# Patient Record
Sex: Female | Born: 1935 | Race: White | Hispanic: No | Smoking: Never smoker
Health system: Southern US, Community
[De-identification: ages and names within clinical notes are randomized; demographics above are authoritative.]

## PROBLEM LIST (undated history)

## (undated) DIAGNOSIS — M1990 Unspecified osteoarthritis, unspecified site: Secondary | ICD-10-CM

## (undated) DIAGNOSIS — I10 Essential (primary) hypertension: Secondary | ICD-10-CM

## (undated) DIAGNOSIS — J449 Chronic obstructive pulmonary disease, unspecified: Secondary | ICD-10-CM

## (undated) DIAGNOSIS — M419 Scoliosis, unspecified: Secondary | ICD-10-CM

## (undated) HISTORY — PX: KNEE SURGERY: SHX244

---

## 2004-11-22 ENCOUNTER — Inpatient Hospital Stay (HOSPITAL_COMMUNITY): Admission: EM | Admit: 2004-11-22 | Discharge: 2004-12-01 | Payer: Self-pay | Admitting: Emergency Medicine

## 2008-08-25 ENCOUNTER — Encounter (INDEPENDENT_AMBULATORY_CARE_PROVIDER_SITE_OTHER): Payer: Self-pay | Admitting: Family Medicine

## 2008-08-25 ENCOUNTER — Ambulatory Visit: Payer: Self-pay | Admitting: Cardiology

## 2008-08-25 ENCOUNTER — Ambulatory Visit (HOSPITAL_COMMUNITY): Admission: RE | Admit: 2008-08-25 | Discharge: 2008-08-25 | Payer: Self-pay | Admitting: Family Medicine

## 2010-07-29 ENCOUNTER — Inpatient Hospital Stay (HOSPITAL_COMMUNITY): Admission: EM | Admit: 2010-07-29 | Discharge: 2010-08-02 | Payer: Self-pay | Admitting: Emergency Medicine

## 2010-08-01 ENCOUNTER — Ambulatory Visit: Payer: Self-pay | Admitting: Orthopedic Surgery

## 2010-08-23 ENCOUNTER — Ambulatory Visit (HOSPITAL_COMMUNITY): Admission: RE | Admit: 2010-08-23 | Discharge: 2010-08-23 | Payer: Self-pay | Admitting: Family Medicine

## 2010-10-11 ENCOUNTER — Encounter (HOSPITAL_COMMUNITY)
Admission: RE | Admit: 2010-10-11 | Discharge: 2010-11-10 | Payer: Self-pay | Source: Home / Self Care | Attending: Family Medicine | Admitting: Family Medicine

## 2010-10-11 ENCOUNTER — Ambulatory Visit (HOSPITAL_COMMUNITY): Payer: Self-pay | Admitting: Family Medicine

## 2010-12-03 ENCOUNTER — Encounter: Payer: Self-pay | Admitting: Family Medicine

## 2010-12-13 NOTE — Consult Note (Signed)
Summary: Hospital consult T-spine  Hospital consult T-spine   Imported By: Cammie Sickle 08/10/2010 14:09:52  _____________________________________________________________________  External Attachment:    Type:   Image     Comment:   External Document

## 2011-01-26 LAB — BASIC METABOLIC PANEL
BUN: 10 mg/dL (ref 6–23)
CO2: 29 mEq/L (ref 19–32)
Calcium: 9.5 mg/dL (ref 8.4–10.5)
Chloride: 101 mEq/L (ref 96–112)
Creatinine, Ser: 0.46 mg/dL (ref 0.4–1.2)
GFR calc Af Amer: >60 mL/min (ref >60)
GFR calc non Af Amer: >60 mL/min (ref >60)
Glucose, Bld: 101 mg/dL — ABNORMAL HIGH (ref 70–99)
Potassium: 4.4 mEq/L (ref 3.5–5.1)
Sodium: 140 mEq/L (ref 135–145)

## 2011-01-26 LAB — POCT CARDIAC MARKERS
CKMB, poc: 1.1 ng/mL (ref 1.0–8.0)
Myoglobin, poc: 57.2 ng/mL (ref 12–200)
Troponin i, poc: <0.05 ng/mL (ref 0.00–0.09)

## 2011-01-26 LAB — COMPREHENSIVE METABOLIC PANEL
ALT: 17 U/L (ref 0–35)
AST: 21 U/L (ref 0–37)
Albumin: 3.3 g/dL — ABNORMAL LOW (ref 3.5–5.2)
Alkaline Phosphatase: 61 U/L (ref 39–117)
BUN: 9 mg/dL (ref 6–23)
CO2: 28 mEq/L (ref 19–32)
Calcium: 8.9 mg/dL (ref 8.4–10.5)
Chloride: 104 mEq/L (ref 96–112)
Creatinine, Ser: 0.45 mg/dL (ref 0.4–1.2)
GFR calc Af Amer: >60 mL/min (ref >60)
GFR calc non Af Amer: >60 mL/min (ref >60)
Glucose, Bld: 86 mg/dL (ref 70–99)
Potassium: 4 mEq/L (ref 3.5–5.1)
Sodium: 140 mEq/L (ref 135–145)
Total Bilirubin: 0.6 mg/dL (ref 0.3–1.2)
Total Protein: 6.3 g/dL (ref 6.0–8.3)

## 2011-01-26 LAB — DIFFERENTIAL
Basophils Absolute: 0 10*3/uL (ref 0.0–0.1)
Basophils Relative: 0 % (ref 0–1)
Eosinophils Absolute: 0 10*3/uL (ref 0.0–0.7)
Eosinophils Relative: 0 % (ref 0–5)
Lymphocytes Relative: 17 % (ref 12–46)
Lymphs Abs: 1.3 10*3/uL (ref 0.7–4.0)
Monocytes Absolute: 0.3 10*3/uL (ref 0.1–1.0)
Monocytes Relative: 4 % (ref 3–12)
Neutro Abs: 6 10*3/uL (ref 1.7–7.7)
Neutrophils Relative %: 78 % — ABNORMAL HIGH (ref 43–77)

## 2011-01-26 LAB — CBC
HCT: 40.6 % (ref 36.0–46.0)
Hemoglobin: 13.6 g/dL (ref 12.0–15.0)
MCH: 31 pg (ref 26.0–34.0)
MCHC: 33.5 g/dL (ref 30.0–36.0)
MCV: 92.5 fL (ref 78.0–100.0)
Platelets: 200 10*3/uL (ref 150–400)
RBC: 4.4 MIL/uL (ref 3.87–5.11)
RDW: 13.4 % (ref 11.5–15.5)
WBC: 7.7 10*3/uL (ref 4.0–10.5)

## 2011-01-26 LAB — GLUCOSE, CAPILLARY
Glucose-Capillary: 114 mg/dL — ABNORMAL HIGH (ref 70–99)
Glucose-Capillary: 94 mg/dL (ref 70–99)
Glucose-Capillary: 98 mg/dL (ref 70–99)

## 2011-01-26 LAB — LIPID PANEL
Cholesterol: 270 mg/dL — ABNORMAL HIGH (ref 0–200)
HDL: 83 mg/dL (ref >39)
LDL Cholesterol: 161 mg/dL — ABNORMAL HIGH (ref 0–99)
Total CHOL/HDL Ratio: 3.3 RATIO
Triglycerides: 131 mg/dL (ref <150)
VLDL: 26 mg/dL (ref 0–40)

## 2011-01-26 LAB — D-DIMER, QUANTITATIVE: D-Dimer, Quant: 2.25 ug/mL-FEU — ABNORMAL HIGH (ref 0.00–0.48)

## 2011-03-31 NOTE — Discharge Summary (Signed)
[name], [name] NO.:  [id number]   MEDICAL RECORD NO.:  [id number]          PATIENT TYPE:  INP   LOCATION:  A336                          FACILITY:  [facility]   PHYSICIAN:  Dalia Heading, M.D.  DATE OF BIRTH:  02-09-1926   DATE OF ADMISSION:  11/22/2004  DATE OF DISCHARGE:  01/19/2006LH                                 DISCHARGE SUMMARY   AGE:  77 years old.   HOSPITAL COURSE SUMMARY:  The patient is a 77 year old white female who  presented with a 1-week history of nausea and vomiting.  She was noted at  the time of admission on acute abdominal series to have a partial small  bowel obstruction.  She did present to the hospital with azotemia due to  dehydration.  She was admitted to the hospital for hydration and a surgery  consult.  Once her azotemia had resolved, a CT scan of the abdomen and  pelvis revealed a small bowel obstruction secondary to a right groin hernia.  This seemed to worsen during her stay.  This cannot be reduced at bedside,  and thus the patient was taken to the operating room on November 24, 2004 and  underwent an exploratory laparotomy, partial small bowel resection and right  femoral herniorrhaphy.  She tolerated the surgery well.  Postoperative  course was remarkable for hypokalemia which was treated.  She was also noted  to have hypophosphatemia and hypomagnesemia.  Both of these were also  treated.  Her diet was advanced without difficulty once her bowel function  returned.  She did have some diarrhea which has subsequently resolved.   The patient is being discharged home on December 01, 2004 in good, improving  condition.   DISCHARGE INSTRUCTIONS:  The patient is to follow up with Dr. Franky Macho  on December 06, 2004.   DISCHARGE MEDICATIONS:  Include Tylenol P.M. as needed, Lipitor and Hyzaar.   PRINCIPAL DIAGNOSES:  1.  Strangulated right femoral hernia.  2.  Hypokalemia.  3.  Hypomagnesemia.  4.  Hypophosphatemia.  5.   Diarrhea, resolved.   PRINCIPAL PROCEDURES:  Exploratory laparotomy, right femoral herniorrhaphy,  partial small bowel resection on November 24, 2004.     Mark   MAJ/MEDQ  D:  12/01/2004  T:  12/01/2004  Job:  [id number]   cc:   Angus G. Renard Matter, MD  [address]  [place]  [place] [address]  Fax: [phone]

## 2011-03-31 NOTE — Consult Note (Signed)
NAMERAGEN, LAVER NO.:  [id number]   MEDICAL RECORD NO.:  [id number]          PATIENT TYPE:  EMS   LOCATION:  ED                            FACILITY:  [facility]   PHYSICIAN:  Dalia Heading, M.D.  DATE OF BIRTH:  [birth year]   DATE OF CONSULTATION:  11/22/2004  DATE OF DISCHARGE:                                   CONSULTATION   REASON FOR CONSULTATION:  Nausea and vomiting.   HISTORY OF PRESENT ILLNESS:  The patient is a 77 year old white female who  presented to the emergency room with nausea and vomiting.  An acute  abdominal series was performed, which revealed a partial small bowel  obstruction.  The patient  states that she has not had a bowel movement  since last Wednesday, which is six days ago.  She has never had an episode  like this before.  She has had multiple episodes of bilious emesis.  No  fever or chills are noted.  Minimal abdominal pain is noted.   PAST MEDICAL HISTORY:  Hypertension, CVA.   PAST SURGICAL HISTORY:  Appendectomy in the remote past, right hip repair.   CURRENT MEDICATIONS:  Lipitor, Hyzaar, promethazine p.r.n.   ALLERGIES:  No known drug allergies.   REVIEW OF SYSTEMS:  Noncontributory.   PHYSICAL EXAMINATION:  GENERAL:  The patient is a pleasant white female in  no acute distress.  VITAL SIGNS:  She is afebrile, pulse 100, blood pressure 95/66.  ABDOMEN:  The abdomen is noted to be distended but not tense.  No  hepatosplenomegaly, masses, or hernias are identified.  RECTAL:  Deferred at this time.   White blood cell count 10.2, hematocrit 39, platelet count 437,000.  MET-7  is remarkable for a potassium of 3.1, sodium 128, BUN 113, creatinine 1.5.  Urinalysis also shows multiple white blood cells and many bacteria.  She is  nitrite-negative.   IMPRESSION:  Partial small bowel obstruction with renal insufficiency  secondary to dehydration, possible urinary tract infection.   PLAN:  The patient is being admitted by Dr.  Juanetta Gosling for Dr. Renard Matter.  A  nasogastric tube will be placed.  A CT scan of the abdomen and pelvis is  being deferred due to her azotemia.  Her potassium is being supplemented.  Further management is pending the next 24 hours.   I will follow the patient with you.     Mark   MAJ/MEDQ  D:  11/22/2004  T:  11/22/2004  Job:  [id number]   cc:   Angus G. Renard Matter, MD  [address]  [place]  [place] [address]  Fax: [phone]

## 2011-03-31 NOTE — Group Therapy Note (Signed)
Sara [name], Sara [name]              ACCOUNT NO.:  [id number]   MEDICAL RECORD NO.:  [id number]          PATIENT TYPE:  INP   LOCATION:  A336                          FACILITY:  [facility]   PHYSICIAN:  Angus G. Renard Matter, MD   DATE OF BIRTH:  02/03/1926   DATE OF PROCEDURE:  DATE OF DISCHARGE:                                   PROGRESS NOTE   This patient was admitted with what was felt to be partial small bowel  obstruction.  She remains on intravenous fluids with potassium replacement.  Has complained of some intermittent burning abdominal pain.  She remains on  IV Levaquin and morphine.  Is being evaluated by surgical service, Dr.  Lovell Sheehan.   LABORATORIES:  CBC:  WBC 6300, 74 neutrophils, 15 lymphocytes.  Chemistries:  Sodium 144, potassium 4.8, chloride 104, CO2 25.   PHYSICAL EXAMINATION:  VITAL SIGNS:  Blood pressure 136/88, respirations 20,  pulse 86, temperature 97.8.  LUNGS:  Clear to P&A.  HEART:  Regular rhythm.  ABDOMEN:  Patient has minimal tenderness, minimal abdominal distention.   ASSESSMENT:  Patient was admitted with what was felt to be partial bowel  obstruction.  She is being treated conservatively.   PLAN:  Continue current regimen.  Repeat CBC, BMET.  Repeat abdominal  series.     Angu   AGM/MEDQ  D:  11/24/2004  T:  11/24/2004  Job:  [id number]

## 2011-03-31 NOTE — Group Therapy Note (Signed)
NAMEMAYARA, PAULSON              ACCOUNT NO.:  [id number]   MEDICAL RECORD NO.:  [id number]          PATIENT TYPE:  INP   LOCATION:  A336                          FACILITY:  [facility]   PHYSICIAN:  Angus G. Renard Matter, MD   DATE OF BIRTH:  29-Nov-1925   DATE OF PROCEDURE:  DATE OF DISCHARGE:                                   PROGRESS NOTE   SUBJECTIVE:  This patient was admitted with what was felt to be small-bowel  obstruction.  She is being seen as well by Dr. Dalia Heading.  She is  receiving Levaquin for a urinary tract infection, morphine for pain.   OBJECTIVE:  VITAL SIGNS:  Blood pressure 108/73, respirations 20, pulse 98,  temperature 98.  LUNGS:  Lungs clear to P&A.  HEART:  Regular rhythm.  ABDOMEN:  Abdomen soft, minimal tenderness.   ASSESSMENT:  1.  The patient was admitted with small-bowel obstruction.  2.  She does have hypokalemia.   PLAN:  Plan to continue current regimen.  Will add Kay Ciel to IV fluids,  continue regimen, repeat BMET.     Angu   AGM/MEDQ  D:  11/23/2004  T:  11/23/2004  Job:  [id number]

## 2011-03-31 NOTE — Op Note (Signed)
Sara [name], Sara [name] NO.:  [id number]   MEDICAL RECORD NO.:  [id number]          PATIENT TYPE:  INP   LOCATION:  A336                          FACILITY:  [facility]   PHYSICIAN:  Dalia Heading, M.D.  DATE OF BIRTH:  24-Feb-1926   DATE OF PROCEDURE:  11/24/2004  DATE OF DISCHARGE:                                 OPERATIVE REPORT   PREOPERATIVE DIAGNOSIS:  Incarcerated right inguinal hernia.   POSTOPERATIVE DIAGNOSIS:  Strangulated right femoral hernia.   PROCEDURE:  Partial small bowel resection, right femoral herniorrhaphy.   SURGEON:  Dr. Franky Macho.   ANESTHESIA:  General endotracheal.   INDICATIONS:  The patient is a 77 year old white female who presented to the  emergency room with abdominal stent distension, a small bowel obstruction,  and azotemia. She was hydrated, and in order to treat her elevated BUN,  underwent a CT scan of the abdomen and pelvis this morning, which revealed  an incarcerated right inguinal hernia. This could not be reduced at bedside.  This was lying low and was difficult to assess whether this was actually  inguinal and femoral in nature. The patient thus comes to the operating for  an emergent exploratory laparotomy, possible small bowel resection, and  right inguinal hernia.  The risks and benefits of the procedure including  bleeding, infection, cardiopulmonary difficulties, and the possibility of a  bowel resection were fully explained to the patient, who gave informed  consent.   PROCEDURE NOTE:  The patient was placed in supine position. After induction  of general endotracheal anesthesia, the abdomen was prepped and draped using  the usual sterile technique with Betadine. Surgical site confirmation was  performed.   A right groin incision was made down to the mass. On inspection of the mass,  the tear did contain ischemic bowel. It was elected to proceed with a lower  midline incision. The peritoneal cavity was entered  into without difficulty.  The patient was noted to have a short segment of small bowel which was  incarcerated in the right femoral region. This was reduced. A chronic  segment of stricture was noted. Gangrenous changes were noted in the short  segment of bowel, which measured approximately 6 cm at its greatest length.  It was elected to proceed with a resection. A GIA stapler was placed  proximally and distally on the small bowel and fired. The specimen was sent  to pathology for further examination. The mesentery was divided using an LDS  stapler.  A side-to-side enterotomy was performed using the GIA stapler. The  staple line over the enterotomy was closed using a TA 55 stapler. The staple  line was bolstered using 3-0 silk sutures. The mesenteric defect was closed  using a 2-0 chromic gut running suture. The bowel was then returned into the  abdominal cavity. No soilage was noted. The rest of the bowel was inspected  and proximally was noted to be somewhat dilated and distally collapsed. No  other ischemic areas were noted. Of note was the fact the patient also had  evidence of a small portion of the  bladder within the right femoral. This  was also reduced. The properitoneal defect was then closed using a 2-0  chromic gut running suture. The uterus was noted to be within normal limits.  Atretic ovaries were also noted to be present. The abdominal cavity is  copiously irrigated with normal saline. All surgical personnel then changed  their gloves. The fascia was reapproximated using a looped O Novofil running  suture. Subcutaneous layer was irrigated with normal saline. The skin was  closed using staples. Betadine ointment, dry sterile dressing applied.   Next, our attention turned to the right groin region. A large-sized  polypropylene mesh plug was then placed in the area of the femoral hernia  and secured circumferentially to the fascia and soft tissue using 2-0  Novofil interrupted  sutures. The subcutaneous layer was closed using a 2-0  Vicryl interrupted sutures. Skin was closed using staples. Betadine  ointment, dry sterile dressing were applied.   All tape and needle counts correct at the end of the procedures. The patient  was extubated in the operating room and went back to recovery room awake in  stable condition. Complications none.   SPECIMEN:  Small-bowel blood loss, minimal.     Mark   MAJ/MEDQ  D:  11/24/2004  T:  11/24/2004  Job:  [id number]   cc:   Angus G. Renard Matter, MD  [address]  [place]  [place] [address]  Fax: [phone]

## 2011-03-31 NOTE — H&P (Signed)
Sara [name], [name] NO.:  [id number]   MEDICAL RECORD NO.:  [id number]          PATIENT TYPE:  EMS   LOCATION:  ED                            FACILITY:  [facility]   PHYSICIAN:  Edward L. Juanetta Gosling, M.D.DATE OF BIRTH:  Aug 11, 1926   DATE OF ADMISSION:  11/22/2004  DATE OF DISCHARGE:  LH                                HISTORY & PHYSICAL   Patient of Dr. Renard Matter   REASON FOR ADMISSION:  Small-bowel obstruction.   HISTORY:  Ms. Sara [name] is a 77 year old who came to the emergency department  with a 1-week history of vomiting. She says that she has not been able to  keep anything on her stomach for a week. She has had no other new  complaints.  She has not had any fever or chills. She has not had any  diarrhea. She has not be able to keep anything of her medicines down or  anything else.  She eventually came to the emergency room because of  concerns about this problem; and in the emergency room she was found to have  what appeared to be a small-bowel obstruction on her flat and upright  abdominal film.  She has not had any previous history of anything like that.  She has had previous appendectomy, but that was many years ago.  She has a  history of hyperlipidemia and hypertension and takes medications for these.  She has been taking suppositories which have not worked.   MEDICATIONS:  Lipitor and Hyzaar.  I do not know the doses of either one.  She has had a previous stroke.  She has a history of hypertension and  history of hyperlipidemia.   SOCIAL HISTORY:  She lives at home.  She is a nonsmoker, does not drink any  alcohol.   REVIEW OF SYSTEMS:  Her review of systems, except as mentioned is negative.   FAMILY HISTORY:  No known ho any sort of colon cancers, etcetera.   PHYSICAL EXAMINATION:  GENERAL:  Shows a well-developed, well-nourished,  thin female who does not appear to be in any acute distress at this time.  She rates her pain as a 5/10.  VITAL SIGNS:  Her  blood pressure 119/84.  Her initial pulse was 120,  respirations 20.  O2 saturation 93%.  Temperature is 97.7.  HEENT:  Her mucous membranes are dry.  Her nose and throat are clear.  NECK:  Her neck is supple without masses.  CHEST:  Her chest is fairly clear.  HEART:  Regular.  ABDOMEN:  Soft, no definite masses.  Bowel sounds are slightly hyperactive.  EXTREMITIES:  Showed no edema.   LAB WORK:  Her CBC shows white count 10,200; hemoglobin is 13.5; platelets  437.  Sodium 128, potassium 3.1, BUN is 113, creatinine 1.5.  Urinalysis  shows trace of blood, 15% ketones and many bacteria.   PLAN:  The plan then is to begin her on IV fluids with potassium  replacement.  I am going to go ahead and ask for Dr. Lovell Sheehan to see her. I  do not think that she needs  surgery right now.  We will go ahead and treat  her urinary tract infection with Levaquin, give her morphine for pain, and  Zofran for nausea.  Repeat acute abdominal series in the morning, repeat CBC  and BMET in the morning.  She clearly has evidence of fairly marked prerenal  azotemia suggesting that she has not been able to keep anything down for a  week.     Edwa   ELH/MEDQ  D:  11/22/2004  T:  11/22/2004  Job:  [id number]

## 2011-06-20 ENCOUNTER — Encounter (HOSPITAL_COMMUNITY): Payer: Medicare Other | Attending: Family Medicine

## 2011-06-20 DIAGNOSIS — M810 Age-related osteoporosis without current pathological fracture: Secondary | ICD-10-CM | POA: Insufficient documentation

## 2011-06-20 DIAGNOSIS — M818 Other osteoporosis without current pathological fracture: Secondary | ICD-10-CM | POA: Insufficient documentation

## 2011-06-20 LAB — CREATININE, SERUM
Creatinine, Ser: 0.5 mg/dL (ref 0.50–1.10)
GFR calc Af Amer: >60 mL/min (ref >60)
GFR calc non Af Amer: >60 mL/min (ref >60)

## 2011-06-20 LAB — MAGNESIUM: Magnesium: 2.1 mg/dL (ref 1.5–2.5)

## 2011-06-20 LAB — PHOSPHORUS: Phosphorus: 4.5 mg/dL (ref 2.3–4.6)

## 2011-06-20 LAB — CALCIUM: Calcium: 10.1 mg/dL (ref 8.4–10.5)

## 2011-06-20 MED ORDER — DENOSUMAB 120 MG/1.7ML ~~LOC~~ SOLN
120.0000 mg | Freq: Once | SUBCUTANEOUS | Status: DC
  Filled 2011-06-20: qty 1.7

## 2011-06-20 MED ORDER — DENOSUMAB 60 MG/ML ~~LOC~~ SOLN
60.0000 mg | Freq: Once | SUBCUTANEOUS | Status: AC
  Administered 2011-06-20: 60 mg via SUBCUTANEOUS
  Filled 2011-06-20: qty 1

## 2011-06-20 NOTE — Progress Notes (Signed)
VSS.  Specimen obtained from rt [facility] for labs  Prolia 60 mg given in lt lower abd. Subcutaneously.  Tolerated well.

## 2012-10-04 ENCOUNTER — Ambulatory Visit (HOSPITAL_COMMUNITY)
Admission: RE | Admit: 2012-10-04 | Discharge: 2012-10-04 | Disposition: A | Discharge status: Home / Self Care | Payer: Medicare Other | Source: Ambulatory Visit | Attending: Family Medicine | Admitting: Family Medicine

## 2012-10-04 ENCOUNTER — Other Ambulatory Visit (HOSPITAL_COMMUNITY): Payer: Self-pay | Admitting: Family Medicine

## 2012-10-04 DIAGNOSIS — R609 Edema, unspecified: Secondary | ICD-10-CM

## 2012-10-04 DIAGNOSIS — M79609 Pain in unspecified limb: Secondary | ICD-10-CM | POA: Insufficient documentation

## 2012-10-04 DIAGNOSIS — R52 Pain, unspecified: Secondary | ICD-10-CM

## 2012-10-04 DIAGNOSIS — M79671 Pain in right foot: Secondary | ICD-10-CM

## 2012-11-17 ENCOUNTER — Emergency Department (HOSPITAL_COMMUNITY): Payer: Medicare Other

## 2012-11-17 ENCOUNTER — Encounter (HOSPITAL_COMMUNITY): Payer: Self-pay

## 2012-11-17 ENCOUNTER — Observation Stay (HOSPITAL_COMMUNITY)
Admission: EM | Admit: 2012-11-17 | Discharge: 2012-11-19 | DRG: 544 | Disposition: A | Discharge status: Home Health | Payer: Medicare Other | Attending: Family Medicine | Admitting: Family Medicine

## 2012-11-17 DIAGNOSIS — M549 Dorsalgia, unspecified: Secondary | ICD-10-CM

## 2012-11-17 DIAGNOSIS — M810 Age-related osteoporosis without current pathological fracture: Secondary | ICD-10-CM | POA: Diagnosis present

## 2012-11-17 DIAGNOSIS — I10 Essential (primary) hypertension: Secondary | ICD-10-CM | POA: Diagnosis present

## 2012-11-17 DIAGNOSIS — M8448XA Pathological fracture, other site, initial encounter for fracture: Principal | ICD-10-CM | POA: Diagnosis present

## 2012-11-17 DIAGNOSIS — E440 Moderate protein-calorie malnutrition: Secondary | ICD-10-CM | POA: Insufficient documentation

## 2012-11-17 DIAGNOSIS — M4120 Other idiopathic scoliosis, site unspecified: Secondary | ICD-10-CM | POA: Diagnosis present

## 2012-11-17 HISTORY — DX: Scoliosis, unspecified: M41.9

## 2012-11-17 HISTORY — DX: Essential (primary) hypertension: I10

## 2012-11-17 LAB — CBC WITH DIFFERENTIAL/PLATELET
Basophils Absolute: 0 10*3/uL (ref 0.0–0.1)
Lymphocytes Relative: 9 % — ABNORMAL LOW (ref 12–46)
Lymphs Abs: 0.9 10*3/uL (ref 0.7–4.0)
Neutrophils Relative %: 83 % — ABNORMAL HIGH (ref 43–77)
Platelets: 272 10*3/uL (ref 150–400)
RBC: 4.02 MIL/uL (ref 3.87–5.11)
WBC: 9.6 10*3/uL (ref 4.0–10.5)

## 2012-11-17 LAB — COMPREHENSIVE METABOLIC PANEL
ALT: 15 U/L (ref 0–35)
AST: 20 U/L (ref 0–37)
Alkaline Phosphatase: 130 U/L — ABNORMAL HIGH (ref 39–117)
CO2: 29 mEq/L (ref 19–32)
GFR calc Af Amer: >90 mL/min (ref >90)
GFR calc non Af Amer: 86 mL/min — ABNORMAL LOW (ref >90)
Glucose, Bld: 95 mg/dL (ref 70–99)
Potassium: 3.5 mEq/L (ref 3.5–5.1)
Sodium: 136 mEq/L (ref 135–145)
Total Protein: 7.1 g/dL (ref 6.0–8.3)

## 2012-11-17 LAB — URINALYSIS, ROUTINE W REFLEX MICROSCOPIC
Ketones, ur: 40 mg/dL — AB
Nitrite: NEGATIVE
pH: 5.5 (ref 5.0–8.0)

## 2012-11-17 LAB — URINE MICROSCOPIC-ADD ON

## 2012-11-17 MED ORDER — ONDANSETRON HCL 4 MG/2ML IJ SOLN: 4.0000 mg | Freq: Four times a day (QID) | INTRAMUSCULAR | Status: DC | PRN

## 2012-11-17 MED ORDER — HYDROCODONE-ACETAMINOPHEN 5-325 MG PO TABS
1.0000 | ORAL_TABLET | ORAL | Status: DC | PRN
  Administered 2012-11-17 – 2012-11-18 (×3): 1 via ORAL
  Administered 2012-11-18 – 2012-11-19 (×3): 2 via ORAL
  Filled 2012-11-17: qty 2
  Filled 2012-11-17 (×3): qty 1
  Filled 2012-11-17 (×2): qty 2

## 2012-11-17 MED ORDER — ACETAMINOPHEN 650 MG RE SUPP: 650.0000 mg | Freq: Four times a day (QID) | RECTAL | Status: DC | PRN

## 2012-11-17 MED ORDER — ENOXAPARIN SODIUM 40 MG/0.4ML ~~LOC~~ SOLN
40.0000 mg | SUBCUTANEOUS | Status: DC
  Administered 2012-11-17 – 2012-11-18 (×2): 40 mg via SUBCUTANEOUS
  Filled 2012-11-17 (×2): qty 0.4

## 2012-11-17 MED ORDER — ONDANSETRON HCL 4 MG PO TABS: 4.0000 mg | ORAL_TABLET | Freq: Four times a day (QID) | ORAL | Status: DC | PRN

## 2012-11-17 MED ORDER — CLONIDINE HCL 0.1 MG PO TABS
0.1000 mg | ORAL_TABLET | Freq: Two times a day (BID) | ORAL | Status: DC
  Administered 2012-11-17 – 2012-11-19 (×5): 0.1 mg via ORAL
  Filled 2012-11-17 (×5): qty 1

## 2012-11-17 MED ORDER — ACETAMINOPHEN 325 MG PO TABS: 650.0000 mg | ORAL_TABLET | Freq: Four times a day (QID) | ORAL | Status: DC | PRN

## 2012-11-17 MED ORDER — ALUM & MAG HYDROXIDE-SIMETH 200-200-20 MG/5ML PO SUSP: 30.0000 mL | Freq: Four times a day (QID) | ORAL | Status: DC | PRN

## 2012-11-17 MED ORDER — HYDROMORPHONE HCL PF 1 MG/ML IJ SOLN: 0.5000 mg | INTRAMUSCULAR | Status: DC | PRN

## 2012-11-17 MED ORDER — SODIUM CHLORIDE 0.9 % IV SOLN
INTRAVENOUS | Status: DC
  Administered 2012-11-17: 14:00:00 via INTRAVENOUS
  Administered 2012-11-18: 1000 mL via INTRAVENOUS
  Administered 2012-11-19: 02:00:00 via INTRAVENOUS

## 2012-11-17 NOTE — Progress Notes (Addendum)
Pt states she is comfortable at the moment, that she does not hurt if she doesn't move. She does not appear to be in any distress, lying quietly in bed.  BP 167/117. Notified Dr Juanetta Gosling, orders given

## 2012-11-17 NOTE — ED Notes (Signed)
Per ems, pt from home and started having severe back pain.  Pt reports that pain was so severe that she was unable to get out of bed this a.m.  Pt denies any injury.

## 2012-11-17 NOTE — H&P (Signed)
Sara [name] MRN: [id number] DOB/AGE: 12-03-1925 79 y.o. Primary Care Physician:MCINNIS,ANGUS G, MD Admit date: 11/17/2012 Chief Complaint: Back pain HPI: This is an 79 year old who came in with back pain. She says that she has chronic back pain but it got much worse about 2 hours prior to coming to the emergency room. She has been trying pain medication but is not having any relief from it. She wanted to try to go home but when she stands her pain is excruciating and she is not able to ambulate at all. She has a previous history of compression fractures and those have increased since her last x-ray which was in 2011. She has a daughter who lives with her but does not feel that since Sara [name] cannot stand it she can manage her at this point  Past Medical History  Diagnosis Date  . Hypertension   . Scoliosis    History reviewed. No pertinent past surgical history.      No family history on file.  Social History:  reports that she has never smoked. She does not have any smokeless tobacco history on file. She reports that she does not drink alcohol or use illicit drugs.   Allergies: No Known Allergies   (Not in a hospital admission)     WUJ:WJXBJ from the symptoms mentioned above,there are no other symptoms referable to all systems reviewed.  Physical Exam: Blood pressure 160/89, pulse 105, temperature 97.8 F (36.6 C), temperature source Oral, resp. rate 20, SpO2 93.00%. She is awake and alert. She has what appears to be kyphoscoliosis. Her pupils are reactive. Her nose and throat are clear. Her neck is supple without masses. Her chest is clear without wheezes rales or rhonchi. Her heart is regular without gallop. Her abdomen is soft without masses. Extremities showed no edema. She's tender in lumbar area. She does not have any neurological abnormalities    Basename 11/17/12 1108  WBC 9.6  NEUTROABS 7.9*  HGB 12.0  HCT 36.1  MCV 89.8  PLT 272    Basename 11/17/12  1108  NA 136  K 3.5  CL 93*  CO2 29  GLUCOSE 95  BUN 18  CREATININE 0.50  CALCIUM 9.6  MG --  lablast2(ast:2,ALT:2,alkphos:2,bilitot:2,prot:2,albumin:2)@    No results found for this or any previous visit (from the past 240 hour(s)).   Dg Lumbar Spine Complete  11/17/2012  *RADIOLOGY REPORT*  Clinical Data: Low back pain  LUMBAR SPINE - COMPLETE 4+ VIEW  Comparison: 07/19/2010  Findings: Dynamic right femoral nail partly visualized. Bones are osteopenic, which may mask subtle fracture.  Vertebral plana at L1 demonstrates interval progression since the prior exam. Slight interval further loss of height of L2.  Central compression deformities at L3, L4, and L5 again noted.  Apparent interval progression apparent interval progression of thoracic compression deformities also noted, not well evaluated on the current exam.  IMPRESSION: Interval progression of the thoracolumbar compression deformities as above since prior exam 07/29/2010, otherwise age indeterminate.   Original Report Authenticated By: Christiana Pellant, M.D.    Impression: She has compression fractures that are worse. She's having back pain in his unable to stand because of pain. She is unable to ambulate. Active Problems:  * No active hospital problems. *      Plan: She will be brought in for pain control physical therapy et Karie Soda. Her family requests that they be taught  to help with getting her up and transfer so that they can take her home  Moriah Shawley L Pager [phone]  11/17/2012, 1:07 PM

## 2012-11-17 NOTE — ED Provider Notes (Signed)
History    Scribed for Benny Lennert, MD, the patient was seen in room APA19/APA19. This chart was scribed by Katha Cabal.   CSN: [id number]  Arrival date & time 11/17/12  3664   First MD Initiated Contact with Patient 11/17/12 1028      Chief Complaint  Patient presents with  . Back Pain    (Consider location/radiation/quality/duration/timing/severity/associated sxs/prior Treatment)  Benny Lennert, MD entered patient's room at 10:30 AM  Patient is a 79 y.o. female presenting with back pain. The history is provided by the patient and a relative. No language interpreter was used.  Back Pain  This is a chronic problem. The current episode started 1 to 2 hours ago. The problem occurs constantly. The problem has not changed since onset.The pain is associated with no known injury. The pain is present in the lumbar spine. Radiates to: down bilateral legs. The pain is severe. Pertinent negatives include no chest pain, no headaches and no abdominal pain. She has tried bed rest (prescription pain relievers) for the symptoms. The treatment provided no relief.   Patient reports severe lumbar back pain and bilateral leg pain.  Pain increases while trying to stand.  Patient with history of arthritis and multiple compression fractures.      PCP Alice Reichert, MD         Past Medical History  Diagnosis Date  . Hypertension   . Scoliosis     History reviewed. No pertinent past surgical history.  No family history on file.  History  Substance Use Topics  . Smoking status: Never Smoker   . Smokeless tobacco: Not on file  . Alcohol Use: No    OB History    Grav Para Term Preterm Abortions TAB SAB Ect Mult Living                  Review of Systems  Constitutional: Negative for fatigue.  HENT: Negative for congestion, sinus pressure and ear discharge.   Eyes: Negative for discharge.  Respiratory: Negative for cough.   Cardiovascular: Negative for chest pain.    Gastrointestinal: Negative for abdominal pain and diarrhea.  Genitourinary: Negative for frequency and hematuria.  Musculoskeletal: Positive for back pain.        Leg pain   Skin: Negative for rash.  Neurological: Negative for seizures and headaches.  Hematological: Negative.   Psychiatric/Behavioral: Negative for hallucinations.    Allergies  Review of patient's allergies indicates no known allergies.  Home Medications   Current Outpatient Rx  Name  Route  Sig  Dispense  Refill  . HYDROCODONE-ACETAMINOPHEN 5-325 MG PO TABS   Oral   Take 1 tablet by mouth every 4 (four) hours as needed. Pain.           BP 172/102  Temp 97.8 F (36.6 C) (Oral)  Resp 16  Physical Exam  Constitutional: She appears well-developed.  HENT:  Head: Normocephalic and atraumatic.  Eyes: Conjunctivae normal and EOM are normal. No scleral icterus.  Neck: Neck supple. No thyromegaly present.  Cardiovascular: Normal rate and regular rhythm.  Exam reveals no gallop and no friction rub.   No murmur heard. Pulmonary/Chest: No stridor. She has no wheezes. She has no rales. She exhibits no tenderness.  Abdominal: She exhibits no distension. There is no tenderness. There is no rebound.  Musculoskeletal: Normal range of motion. She exhibits tenderness. She exhibits no edema.       Severe scoliosis, moderate lumbar spine tenderness, right knee pain  persistent from previous surgery   Lymphadenopathy:    She has no cervical adenopathy.  Neurological: She is alert. Coordination normal.  Skin: No rash noted. No erythema.  Psychiatric: She has a normal mood and affect. Her behavior is normal.    ED Course  Procedures (including critical care time)     COORDINATION OF CARE: 10:43 AM  Physical exam complete.  Will xray lumbar spine.      LABS / RADIOLOGY:    Labs Reviewed  CBC WITH DIFFERENTIAL  COMPREHENSIVE METABOLIC PANEL  URINALYSIS, ROUTINE W REFLEX MICROSCOPIC   No results  found.       MDM            MEDICATIONS GIVEN IN THE E.D. Scheduled Meds:   Continuous Infusions:       IMPRESSION: No diagnosis found.   NEW MEDICATIONS: New Prescriptions   No medications on file      The chart was scribed for me under my direct supervision.  I personally performed the history, physical, and medical decision making and all procedures in the evaluation of this patient.Benny Lennert, MD 11/17/12 [phone]

## 2012-11-18 ENCOUNTER — Encounter (HOSPITAL_COMMUNITY): Payer: Self-pay | Admitting: Orthopaedic Surgery

## 2012-11-18 LAB — URINE CULTURE
Colony Count: NO GROWTH
Culture: NO GROWTH

## 2012-11-18 NOTE — Progress Notes (Signed)
Subjective:  the patient continues to have pain on left side pain back. The pain increases when she attempts to stand. She does have a history of arthritis and multiple compression fractures. She by x-ray has progression of compression fractures Objective: L3 L4-L5 Vital signs in last 24 hours: Temp:  [97.7 F (36.5 C)-98.5 F (36.9 C)] 97.9 F (36.6 C) (01/06 0552) Pulse Rate:  [91-120] 91  (01/06 0552) Resp:  [16-20] 20  (01/06 0552) BP: (154-179)/(89-117) 176/92 mmHg (01/06 0552) SpO2:  [92 %-95 %] 93 % (01/06 0552) Weight:  [37 kg (81 lb 9.1 oz)] 37 kg (81 lb 9.1 oz) (01/05 1402) Weight change:  Last BM Date: 11/15/12  Intake/Output from previous day: 01/05 0701 - 01/06 0700 In: 440.8 [P.O.:240; I.V.:200.8] Out: 400 [Urine:400] Intake/Output this shift: Total I/O In: -  Out: 400 [Urine:400]  Physical Exam: The patient is alert but complaining of pain over the left side.  Blood pressure 176/92 respiration 20 pulse 91 temp 97.7  HEENT negative  Neck supple no JVD or thyroid abnormalities  Lungs clear to P&A  The patient has severe scoliosis with moderate lumbar spine tenderness   Basename 11/17/12 1108  WBC 9.6  HGB 12.0  HCT 36.1  PLT 272   BMET  Basename 11/17/12 1108  NA 136  K 3.5  CL 93*  CO2 29  GLUCOSE 95  BUN 18  CREATININE 0.50  CALCIUM 9.6    Studies/Results: Dg Lumbar Spine Complete  11/17/2012  *RADIOLOGY REPORT*  Clinical Data: Low back pain  LUMBAR SPINE - COMPLETE 4+ VIEW  Comparison: 07/19/2010  Findings: Dynamic right femoral nail partly visualized. Bones are osteopenic, which may mask subtle fracture.  Vertebral plana at L1 demonstrates interval progression since the prior exam. Slight interval further loss of height of L2.  Central compression deformities at L3, L4, and L5 again noted.  Apparent interval progression apparent interval progression of thoracic compression deformities also noted, not well evaluated on the current exam.   IMPRESSION: Interval progression of the thoracolumbar compression deformities as above since prior exam 07/29/2010, otherwise age indeterminate.   Original Report Authenticated By: Christiana Pellant, M.D.     Medications:     . cloNIDine  0.1 mg Oral BID  . enoxaparin (LOVENOX) injection  40 mg Subcutaneous Q24H       . sodium chloride 1,000 mL (11/18/12 0353)     Assessment/Plan: Compression fractures are worse lumbar spine Plan the patient is brought in for pain control we will obtain orthopedic consult  LOS: 1 day   Evaleigh Mccamy G 11/18/2012, 6:34 AM

## 2012-11-18 NOTE — Progress Notes (Addendum)
Patient attempted to ambulate with walker with nurse tech,  Patient would not bear any weight on right lower extremity, stated that it hurt too bad to stand on it.

## 2012-11-18 NOTE — Evaluation (Signed)
Physical Therapy Evaluation Patient Details Name: Sara [name] MRN: [id number] DOB: 05/17/1926 Today's Date: 11/18/2012 Time: 4098-1191 PT Time Calculation (min): 34 min  PT Assessment / Plan / Recommendation Clinical Impression  Pt was seen for eval.  She reports a decrease in her back pain since yesterday and is, in fact, able to prop herself up on her elbows while lying in bed with no increase in pain.   She is able to transfer to EOB without assist, but this is very slow and labored.  She declined to try to stand due to fatigue.  She appears to have hypersensitivity in the RLE to touch or any knee movement, so this will hopefully not impede her ability to stand.  She states that she has been through this same difficutly before and expects that she will be able to work herself out of it.  She is probably correct.  We can do HHPT if she would like.    PT Assessment  Patient needs continued PT services    Follow Up Recommendations  Home health PT    Does the patient have the potential to tolerate intense rehabilitation      Barriers to Discharge None      Equipment Recommendations  None recommended by PT    Recommendations for Other Services     Frequency Min 5X/week    Precautions / Restrictions Precautions Precautions: Fall Restrictions Weight Bearing Restrictions: No   Pertinent Vitals/Pain       Mobility  Bed Mobility Bed Mobility: Supine to Sit;Sit to Supine Supine to Sit: 5: Supervision;HOB flat Sit to Supine: 4: Min guard Details for Bed Mobility Assistance: pt is able to prop up on her elbows in the bed with no increase in pain...most of her pain occurs with rolling onto her side Transfers Transfers: Not assessed (does not feel up to it today)    Shoulder Instructions     Exercises     PT Diagnosis: Difficulty walking;Acute pain  PT Problem List: Decreased activity tolerance;Decreased mobility;Pain PT Treatment Interventions: Gait training;Functional  mobility training;Patient/family education   PT Goals Acute Rehab PT Goals PT Goal Formulation: With patient Time For Goal Achievement: 12/02/12 Potential to Achieve Goals: Good Pt will go Sit to Stand: with supervision;with upper extremity assist PT Goal: Sit to Stand - Progress: Goal set today Pt will go Stand to Sit: with supervision;with upper extremity assist PT Goal: Stand to Sit - Progress: Goal set today Pt will Transfer Bed to Chair/Chair to Bed: with min assist PT Transfer Goal: Bed to Chair/Chair to Bed - Progress: Goal set today Pt will Ambulate: 1 - 15 feet;with min assist;with rolling walker PT Goal: Ambulate - Progress: Goal set today  Visit Information  Last PT Received On: 11/18/12    Subjective Data  Subjective: I have pain all of the time Patient Stated Goal: return home   Prior Functioning  Home Living Lives With: Daughter Available Help at Discharge: Family;Available 24 hours/day Type of Home: House Home Access: Level entry Home Layout: One level Bathroom Shower/Tub: Tub/shower unit Home Adaptive Equipment: Walker - rolling;Bedside commode/3-in-1;Wheelchair - manual Prior Function Level of Independence: Independent with assistive device(s) Able to Take Stairs?: No Driving: No Vocation: Retired Musician: No difficulties    Cognition  Overall Cognitive Status: Appears within functional limits for tasks assessed/performed Arousal/Alertness: Awake/alert Orientation Level: Appears intact for tasks assessed Behavior During Session: [facility] for tasks performed    Extremity/Trunk Assessment Left Upper Extremity Assessment LUE ROM/Strength/Tone: [facility]  for tasks assessed Right Lower Extremity Assessment RLE ROM/Strength/Tone: WFL for tasks assessed RLE Sensation: WFL - Light Touch RLE Coordination: WFL - gross motor Left Lower Extremity Assessment LLE ROM/Strength/Tone: WFL for tasks assessed LLE Sensation: WFL - Light Touch LLE  Coordination: WFL - gross motor Trunk Assessment Trunk Assessment: Kyphotic (severe)   Balance    End of Session PT - End of Session Activity Tolerance: Patient tolerated treatment well;Patient limited by fatigue Patient left: in bed;with call bell/phone within reach;with bed alarm set Nurse Communication: Mobility status  GP Functional Assessment Tool Used: clinical judgement Functional Limitation: Mobility: Walking and moving around Mobility: Walking and Moving Around Current Status (Z6109): At least 1 percent but less than 20 percent impaired, limited or restricted Mobility: Walking and Moving Around Goal Status [phone]): 0 percent impaired, limited or restricted   Konrad Penta 11/18/2012, 3:17 PM

## 2012-11-18 NOTE — Consult Note (Signed)
Reason for Consult:Back pain, new compression fracture  Referring Physician: Keirstan Iannello is an 79 y.o. female.  HPI: She has long history of lower and thoracic back pain with severe osteoporosis and kyphosis of the spine thoracic area.  She has had compression fractures in the past and now appears to have new compression fractures of L1 and L2 on top of prior compressions.  Neurologically she has been intact.  She stumbled at home about two weeks ago and the back pain has gotten worse.  Her daughter lives with her and is present.  The patient has no numbness or neurological problems.  Past Medical History  Diagnosis Date  . Hypertension   . Scoliosis     History reviewed. No pertinent past surgical history.  No family history on file.  Social History:  reports that she has never smoked. She does not have any smokeless tobacco history on file. She reports that she does not drink alcohol or use illicit drugs.  Allergies: No Known Allergies  Medications: I have reviewed the patient's current medications.  Results for orders placed during the hospital encounter of 11/17/12 (from the past 48 hour(s))  URINALYSIS, ROUTINE W REFLEX MICROSCOPIC     Status: Abnormal   Collection Time   11/17/12 11:06 AM      Component Value Range Comment   Color, Urine YELLOW  YELLOW    APPearance HAZY (*) CLEAR    Specific Gravity, Urine >1.030 (*) 1.005 - 1.030    pH 5.5  5.0 - 8.0    Glucose, UA NEGATIVE  NEGATIVE mg/dL    Hgb urine dipstick MODERATE (*) NEGATIVE    Bilirubin Urine MODERATE (*) NEGATIVE    Ketones, ur 40 (*) NEGATIVE mg/dL    Protein, ur 161 (*) NEGATIVE mg/dL    Urobilinogen, UA 0.2  0.0 - 1.0 mg/dL    Nitrite NEGATIVE  NEGATIVE    Leukocytes, UA NEGATIVE  NEGATIVE   URINE MICROSCOPIC-ADD ON     Status: Abnormal   Collection Time   11/17/12 11:06 AM      Component Value Range Comment   WBC, UA 7-10  <3 WBC/hpf    RBC / HPF 7-10  <3 RBC/hpf    Bacteria, UA MANY (*)  RARE   CBC WITH DIFFERENTIAL     Status: Abnormal   Collection Time   11/17/12 11:08 AM      Component Value Range Comment   WBC 9.6  4.0 - 10.5 K/uL    RBC 4.02  3.87 - 5.11 MIL/uL    Hemoglobin 12.0  12.0 - 15.0 g/dL    HCT 09.6  04.5 - 40.9 %    MCV 89.8  78.0 - 100.0 fL    MCH 29.9  26.0 - 34.0 pg    MCHC 33.2  30.0 - 36.0 g/dL    RDW 81.1  91.4 - 78.2 %    Platelets 272  150 - 400 K/uL    Neutrophils Relative 83 (*) 43 - 77 %    Neutro Abs 7.9 (*) 1.7 - 7.7 K/uL    Lymphocytes Relative 9 (*) 12 - 46 %    Lymphs Abs 0.9  0.7 - 4.0 K/uL    Monocytes Relative 8  3 - 12 %    Monocytes Absolute 0.8  0.1 - 1.0 K/uL    Eosinophils Relative 0  0 - 5 %    Eosinophils Absolute 0.0  0.0 - 0.7 K/uL  Basophils Relative 0  0 - 1 %    Basophils Absolute 0.0  0.0 - 0.1 K/uL   COMPREHENSIVE METABOLIC PANEL     Status: Abnormal   Collection Time   11/17/12 11:08 AM      Component Value Range Comment   Sodium 136  135 - 145 mEq/L    Potassium 3.5  3.5 - 5.1 mEq/L    Chloride 93 (*) 96 - 112 mEq/L    CO2 29  19 - 32 mEq/L    Glucose, Bld 95  70 - 99 mg/dL    BUN 18  6 - 23 mg/dL    Creatinine, Ser 1.61  0.50 - 1.10 mg/dL    Calcium 9.6  8.4 - 09.6 mg/dL    Total Protein 7.1  6.0 - 8.3 g/dL    Albumin 3.5  3.5 - 5.2 g/dL    AST 20  0 - 37 U/L    ALT 15  0 - 35 U/L    Alkaline Phosphatase 130 (*) 39 - 117 U/L    Total Bilirubin 0.6  0.3 - 1.2 mg/dL    GFR calc non Af Amer 86 (*) >90 mL/min    GFR calc Af Amer >90  >90 mL/min     Dg Lumbar Spine Complete  11/17/2012  *RADIOLOGY REPORT*  Clinical Data: Low back pain  LUMBAR SPINE - COMPLETE 4+ VIEW  Comparison: 07/19/2010  Findings: Dynamic right femoral nail partly visualized. Bones are osteopenic, which may mask subtle fracture.  Vertebral plana at L1 demonstrates interval progression since the prior exam. Slight interval further loss of height of L2.  Central compression deformities at L3, L4, and L5 again noted.  Apparent interval  progression apparent interval progression of thoracic compression deformities also noted, not well evaluated on the current exam.  IMPRESSION: Interval progression of the thoracolumbar compression deformities as above since prior exam 07/29/2010, otherwise age indeterminate.   Original Report Authenticated By: Christiana Pellant, M.D.     Review of Systems  Musculoskeletal: Positive for back pain (She has a long history of back pain, thoracic and lumbar over the years with history of osteoporosis and multiple compression fractures.  She uses a walker.).   Blood pressure 176/92, pulse 91, temperature 97.9 F (36.6 C), temperature source Oral, resp. rate 20, height 5\' 2"  (1.575 m), weight 37 kg (81 lb 9.1 oz), SpO2 93.00%. Physical Exam  Constitutional: She is oriented to person, place, and time. She appears well-developed and well-nourished.  HENT:  Head: Normocephalic.  Eyes: Conjunctivae normal and EOM are normal. Pupils are equal, round, and reactive to light.  Neck: Normal range of motion. Neck supple.  Cardiovascular: Normal rate, regular rhythm and intact distal pulses.   Respiratory: Effort normal and breath sounds normal.  GI: Soft. Bowel sounds are normal.  Musculoskeletal:       She has marked kyphosis of the thoracic spine but neurologically intact.  She has well healed scar of the right knee with painful motion and slight effusion.    Neurological: She is alert and oriented to person, place, and time. She has normal reflexes.  Skin: Skin is warm and dry.  Psychiatric: She has a normal mood and affect. Judgment and thought content normal.    Assessment/Plan: New Compression Fractures of L1 and L2 with history of prior compression fractures here; severe osteoporosis of thoracic and lumbar spine.  She will need pain control.  I do not feel she will tolerate a brace.  I  would not recommend any kyphoplasty for her either.  I would recommend supplementation weekly with Fosamax or monthly  with Boniva.  I have ordered a wheelchair for her at her home.  I can see in my office in two weeks.  Rajon Bisig 11/18/2012, 2:10 PM

## 2012-11-18 NOTE — Care Management Note (Addendum)
    Page 1 of 2   11/20/2012     10:55:56 AM   CARE MANAGEMENT NOTE 11/20/2012  Patient:  Sara [name], Sara [name]   Account Number:  [id number]  Date Initiated:  11/18/2012  Documentation initiated by:  Sharrie Rothman  Subjective/Objective Assessment:   Pt admitted from home with compression fractures and uncontrolled pain. Pt lives with her daughter Sara [name] and wants tot return home at discharge. Pt has a wheelchair, walker, and shower chair for home use.     Action/Plan:   Pt may benefit from [facility] PT and RN at discharge. Will continue to follow.   Anticipated DC Date:  11/19/2012   Anticipated DC Plan:  HOME W HOME HEALTH SERVICES      DC Planning Services  CM consult      [facility] Choice  HOME HEALTH   Choice offered to / List presented to:  C-1 Patient        HH arranged  HH-2 PT      [facility] agency  [facility]   Status of service:  Completed, signed off Medicare Important Message given?   (If response is "NO", the following Medicare IM given date fields will be blank) Date Medicare IM given:   Date Additional Medicare IM given:    Discharge Disposition:  HOME W HOME HEALTH SERVICES  Per UR Regulation:    If discussed at Long Length of Stay Meetings, dates discussed:    Comments:  11/20/12 1052 Arlyss Queen, RN BSN CM Pt discharged home on 11/19/12 with [facility]. CM received phone call from [facility] and they are unable to accept pt. HH arranged with [facility]. Alroy Bailiff of [facility] is aware and will collect the pts information from the chart. CM notified pt of change in [facility] agency.  11/19/12 1540 Arlyss Queen, RN BSN CM Pt discharged home today with [facility] for PT. Order sent to [facility]. No DME needs noted. Pt and pts nurse aware of discharge arrangements. Pt is requesting [facility] for transport. CSW to arrange transport with EMS.  11/18/12 1145 Arlyss Queen, RN BSN CM

## 2012-11-18 NOTE — Progress Notes (Signed)
UR chart review completed.  

## 2012-11-19 MED ORDER — CLONIDINE HCL 0.1 MG PO TABS: 0.1000 mg | ORAL_TABLET | Freq: Two times a day (BID) | ORAL | Status: DC

## 2012-11-19 NOTE — Clinical Social Work Note (Signed)
Pt and family requesting transport via [facility]. CSW arranged and verified address and that family would be present upon arrival.   Derenda Fennel, [place] [phone]

## 2012-11-19 NOTE — Discharge Summary (Signed)
Sara [name], Sara [name]              ACCOUNT NO.:  [id number]  MEDICAL RECORD NO.:  [id number]  LOCATION:  A318                          FACILITY:  [facility]  PHYSICIAN:  Lemonte Al G. Renard Matter, MD   DATE OF BIRTH:  02/22/1926  DATE OF ADMISSION:  11/17/2012 DATE OF DISCHARGE:  01/07/2014LH                              DISCHARGE SUMMARY   This 79 year old female was admitted on November 17, 2012 and discharged on November 19, 2012, two days hospitalization.  DIAGNOSES:  New compression fracture of L1-2, two prior compression fracture of thoracic and lumbar vertebra, scoliosis, severe osteoporosis of the thoracic and lumbar spine.  CONDITION:  Stable and improved at the time of discharge.  This patient has chronic back pain, but this got much worse 2 hours prior to coming to the emergency room.  The patient, when standing up, pain was excruciating and she was unable to ambulate.  She has a previous history of compression fractures that have increased since her last x-ray in 2011.  Her daughter felt that she could not manage the patient at home since she could not stand.  She was therefore admitted.  PHYSICAL EXAMINATION ON ADMISSION:  VITAL SIGNS:  Alert patient with blood pressure 160/89, pulse 105, temp 97.8, respirations 20. HEENT:  Eyes PERRLA.  TM negative.  Oropharynx benign. NECK:  Supple.  No JVD or thyroid abnormalities. HEART:  Regular rhythm.  No murmurs. LUNGS:  Clear to P and A. ABDOMEN:  No palpable organs or masses. EXTREMITIES:  No edema. BACK:  She has scoliosis and is quite tender in the lumbar area and did not have any neurological abnormalities.  LABORATORY DATA:  WBC 9.6, hemoglobin 12.2, hematocrit 36.1. Chemistries:  Sodium 136, potassium 3.5, chloride 93, CO2 29, glucose 95, BUN 18, creatinine 0.5.  Alkaline phosphatase 2, bilirubin 2, protein 2, albumin 2.  IMAGING DATA:  X-ray studies of the lumbar spine, bones are osteopenic. L1 interval progression since prior  exam.  Slight interval further loss of height of L2, central compression deformities at L3-4 and 5 again noted.  HOSPITAL COURSE:  The patient was placed at bed rest.  She was continued on her regular medications, Catapres 0.1 mg b.i.d.  Enoxaparin 40 mg was given subcutaneously daily.  She remained on IV saline.  She was given hydrocodone/acetaminophen 2 tablets every 4 hours p.r.n. for pain. Occasionally, we had to use hydromorphone 0.5 mg intravenously q.4 h. p.r.n. for discomfort.  The patient remained as an bed patient.  She did have help of physical therapy and it is apparent that she will need this at home.  She was seen in consultation by Dr. Hilda Lias in Orthopedics who felt conservative plan was appropriate.  He did suggest he could see her later in the office.  He does not feel that any kyphoplasty could be done or any other surgical intervention was needed, but he did suggest weekly Fosamax or monthly Boniva.  We plan set up physical therapy as an outpatient.     Kaelyn Nauta G. Renard Matter, MD     AGM/MEDQ  D:  11/19/2012  T:  11/19/2012  Job:  [id number]

## 2012-11-19 NOTE — Progress Notes (Signed)
D/c instructions reviewed with patient, daughter, and daughter-in-law.  Verbalized understanding.  Pt dc'd to home via EMS with daughter.  Schonewitz, Candelaria Stagers 11/19/2012

## 2012-11-20 NOTE — Discharge Summary (Signed)
NAMEGEORGANA, Sara [name]              ACCOUNT NO.:  [id number]  MEDICAL RECORD NO.:  [id number]  LOCATION:  A318                          FACILITY:  [facility]  PHYSICIAN:  Favian Kittleson G. Renard Matter, MD   DATE OF BIRTH:  31-May-1926  DATE OF ADMISSION:  11/17/2012 DATE OF DISCHARGE:  01/07/2014LH                              DISCHARGE SUMMARY   ADDENDUM:  The patient was discharged on the following medications. Clonidine 0.1 mg b.i.d. and was to resume Vicodin 5/325 every 4 hours as needed for pain.  Patient was in stable condition at the time of discharge.     Caitrin Pendergraph G. Renard Matter, MD     AGM/MEDQ  D:  11/19/2012  T:  11/20/2012  Job:  [id number]

## 2013-04-18 ENCOUNTER — Emergency Department (HOSPITAL_COMMUNITY): Payer: Medicare Other

## 2013-04-18 ENCOUNTER — Encounter (HOSPITAL_COMMUNITY): Payer: Self-pay | Admitting: *Deleted

## 2013-04-18 ENCOUNTER — Emergency Department (HOSPITAL_COMMUNITY)
Admission: EM | Admit: 2013-04-18 | Discharge: 2013-04-18 | Disposition: A | Discharge status: Home / Self Care | Payer: Medicare Other | Attending: Emergency Medicine | Admitting: Emergency Medicine

## 2013-04-18 DIAGNOSIS — M25561 Pain in right knee: Secondary | ICD-10-CM

## 2013-04-18 DIAGNOSIS — Z8739 Personal history of other diseases of the musculoskeletal system and connective tissue: Secondary | ICD-10-CM | POA: Insufficient documentation

## 2013-04-18 DIAGNOSIS — R52 Pain, unspecified: Secondary | ICD-10-CM | POA: Insufficient documentation

## 2013-04-18 DIAGNOSIS — Z79899 Other long term (current) drug therapy: Secondary | ICD-10-CM | POA: Insufficient documentation

## 2013-04-18 DIAGNOSIS — I10 Essential (primary) hypertension: Secondary | ICD-10-CM | POA: Insufficient documentation

## 2013-04-18 DIAGNOSIS — M25569 Pain in unspecified knee: Secondary | ICD-10-CM | POA: Insufficient documentation

## 2013-04-18 HISTORY — DX: Unspecified osteoarthritis, unspecified site: M19.90

## 2013-04-18 MED ORDER — OXYCODONE-ACETAMINOPHEN 5-325 MG PO TABS: 1.0000 | ORAL_TABLET | Freq: Four times a day (QID) | ORAL | Status: DC | PRN

## 2013-04-18 MED ORDER — OXYCODONE-ACETAMINOPHEN 5-325 MG PO TABS
1.0000 | ORAL_TABLET | Freq: Once | ORAL | Status: AC
  Administered 2013-04-18: 1 via ORAL
  Filled 2013-04-18: qty 1

## 2013-04-18 NOTE — ED Provider Notes (Signed)
History    This chart was scribed for Benny Lennert, MD by Toya Smothers, ED Scribe. The patient was seen in room APA19/APA19. Patient's care was started at 1810.   CSN: [id number]  Arrival date & time 04/18/13  1810   First MD Initiated Contact with Patient 04/18/13 2047      Chief Complaint  Patient presents with  . Leg Pain   Patient is a 79 y.o. female presenting with leg pain. The history is provided by the patient and a relative.  Leg Pain Location:  Leg, knee, foot and ankle Leg location:  L leg and R leg Knee location:  R knee Ankle location:  R ankle Pain details:    Quality:  Dull and aching   Radiates to:  Does not radiate   Severity:  Moderate   Onset quality:  Unable to specify   Duration:  2 days   Timing:  Constant   Progression:  Worsening Chronicity:  Recurrent Dislocation: no   Foreign body present:  No foreign bodies Tetanus status:  Unknown Prior injury to area:  Yes Worsened by:  Bearing weight, flexion and extension Ineffective treatments:  Elevation and immobilization Associated symptoms: no back pain and no fever     HPI Comments: Sara [name] is a 79 y.o. female with h/o HTN, arthritis, and sclerosis who presents to the Emergency Department complaining of 24 hours of new, gradual onset, bilateral leg pain. No injury denoted. Pain is described as an aching soreness, worse on the right, aggravated with weight bearing and ambulation, and alleviated by nothing. Per daughter, before onset, Pt was able to "walk using a walker without pain and now she can barely stand." Pt lists Hx of right knee surgery and prior injury to the right knee. Pt denies headache, diaphoresis, fever, chills, nausea, vomiting, diarrhea, weakness, cough, SOB and any other pain. Pt denies use of tobacco, alcohol, and illicit drug use.   PCP: Dr. Renard Matter   Past Medical History  Diagnosis Date  . Hypertension   . Scoliosis   . Arthritis     Past Surgical History   Procedure Laterality Date  . Knee surgery      History reviewed. No pertinent family history.  History  Substance Use Topics  . Smoking status: Never Smoker   . Smokeless tobacco: Not on file  . Alcohol Use: No    Review of Systems  Constitutional: Negative for fever.       10 Systems reviewed and are negative for acute change except as noted in the HPI.  HENT: Negative for congestion.   Eyes: Negative for discharge and redness.  Respiratory: Negative for cough and shortness of breath.   Cardiovascular: Negative for chest pain.  Gastrointestinal: Negative for vomiting and abdominal pain.  Musculoskeletal: Positive for gait problem. Negative for back pain.  Skin: Negative for rash.  Neurological: Negative for syncope, numbness and headaches.  Psychiatric/Behavioral:       No behavior change.    Allergies  Review of patient's allergies indicates no known allergies.  Home Medications   Current Outpatient Rx  Name  Route  Sig  Dispense  Refill  . cloNIDine (CATAPRES) 0.1 MG tablet   Oral   Take 1 tablet (0.1 mg total) by mouth 2 (two) times daily.   60 tablet      . HYDROcodone-acetaminophen (NORCO/VICODIN) 5-325 MG per tablet   Oral   Take 1 tablet by mouth every 4 (four) hours as needed. Pain.  BP 154/109  Pulse 115  Temp(Src) 98.2 F (36.8 C) (Oral)  Resp 18  Ht 5\' 3"  (1.6 m)  Wt 80 lb (36.288 kg)  BMI 14.18 kg/m2  SpO2 96%  Physical Exam  Nursing note and vitals reviewed. Constitutional: She is oriented to person, place, and time. She appears well-developed.  Cachectic  HENT:  Head: Normocephalic.  Eyes: Conjunctivae are normal.  Neck: No tracheal deviation present.  Cardiovascular:  No murmur heard. Musculoskeletal: Normal range of motion.  Right knee tender and swollen Pain with flexion and extension of the right knee  Neurological: She is oriented to person, place, and time.  Skin: Skin is warm.  Psychiatric: She has a normal  mood and affect.    ED Course  Procedures DIAGNOSTIC STUDIES: Oxygen Saturation is 96% on room air, normal by my interpretation.    COORDINATION OF CARE: 20:49- Evaluated Pt. Pt is awake, alert, and without distress. 20:54- Patient and family understand and agree with initial ED impression and plan with expectations set for ED visit. 21:45- Ordered DG Knee Complete 4 Views Right and DG Hip Complete Right 1 time imaging.    Labs Reviewed - No data to display Dg Hip Complete Right  04/18/2013   *RADIOLOGY REPORT*  Clinical Data: Right hip pain.  RIGHT HIP - COMPLETE 2+ VIEW  Comparison: None  Findings: Plain dynamic screw fixation within the proximal right femur is noted. No hardware complicating features are noted. There is no evidence of acute fracture, subluxation or dislocation. No focal bony lesions are present. Diffuse osteopenia is present.  IMPRESSION: No evidence of acute bony abnormality.   Original Report Authenticated By: Harmon Pier, M.D.   Dg Knee Complete 4 Views Right  04/18/2013   *RADIOLOGY REPORT*  Clinical Data: Right knee pain.  History of prior surgeries.  RIGHT KNEE - COMPLETE 4+ VIEW  Comparison: None  Findings: A displaced inferior patellar fracture appears chronic but correlate clinically. Severe tricompartmental degenerative changes are noted as well as diffuse osteopenia/osteoporosis. Surgical screw within the patella is noted. Post-traumatic deformity of the distal femur is noted. Probable remote injuries of the proximal tibia and fibula are noted.  IMPRESSION: Displaced inferior patellar fracture - appears chronic but correlate with pain.  Severe tricompartmental degenerative changes, osteoporosis and post- traumatic changes described.   Original Report Authenticated By: Harmon Pier, M.D.     No diagnosis found.    MDM   Pt with knee pain and fx patella unknown age.  She has help at home and will follow up with ortho    The chart was scribed for me under my  direct supervision.  I personally performed the history, physical, and medical decision making and all procedures in the evaluation of this patient.Benny Lennert, MD 04/18/13 [phone]

## 2013-04-18 NOTE — ED Notes (Signed)
Pt with right knee and lower leg pain, states not able to walk on it, denies injury

## 2013-05-11 ENCOUNTER — Encounter (HOSPITAL_COMMUNITY): Payer: Self-pay

## 2013-05-11 ENCOUNTER — Emergency Department (HOSPITAL_COMMUNITY): Payer: Medicare Other

## 2013-05-11 ENCOUNTER — Emergency Department (HOSPITAL_COMMUNITY)
Admission: EM | Admit: 2013-05-11 | Discharge: 2013-05-11 | Disposition: A | Discharge status: Home / Self Care | Payer: Medicare Other | Attending: Emergency Medicine | Admitting: Emergency Medicine

## 2013-05-11 DIAGNOSIS — Y939 Activity, unspecified: Secondary | ICD-10-CM | POA: Insufficient documentation

## 2013-05-11 DIAGNOSIS — I10 Essential (primary) hypertension: Secondary | ICD-10-CM | POA: Insufficient documentation

## 2013-05-11 DIAGNOSIS — R4182 Altered mental status, unspecified: Secondary | ICD-10-CM | POA: Insufficient documentation

## 2013-05-11 DIAGNOSIS — N390 Urinary tract infection, site not specified: Secondary | ICD-10-CM | POA: Insufficient documentation

## 2013-05-11 DIAGNOSIS — Y9289 Other specified places as the place of occurrence of the external cause: Secondary | ICD-10-CM | POA: Insufficient documentation

## 2013-05-11 DIAGNOSIS — W1811XA Fall from or off toilet without subsequent striking against object, initial encounter: Secondary | ICD-10-CM | POA: Insufficient documentation

## 2013-05-11 DIAGNOSIS — S8001XA Contusion of right knee, initial encounter: Secondary | ICD-10-CM

## 2013-05-11 DIAGNOSIS — S8000XA Contusion of unspecified knee, initial encounter: Secondary | ICD-10-CM | POA: Insufficient documentation

## 2013-05-11 DIAGNOSIS — Z8739 Personal history of other diseases of the musculoskeletal system and connective tissue: Secondary | ICD-10-CM | POA: Insufficient documentation

## 2013-05-11 DIAGNOSIS — Z993 Dependence on wheelchair: Secondary | ICD-10-CM | POA: Insufficient documentation

## 2013-05-11 LAB — CBC WITH DIFFERENTIAL/PLATELET
Basophils Relative: 0 % (ref 0–1)
Eosinophils Absolute: 0 10*3/uL (ref 0.0–0.7)
Eosinophils Relative: 0 % (ref 0–5)
HCT: 34.8 % — ABNORMAL LOW (ref 36.0–46.0)
Hemoglobin: 11.4 g/dL — ABNORMAL LOW (ref 12.0–15.0)
MCH: 29.5 pg (ref 26.0–34.0)
MCHC: 32.8 g/dL (ref 30.0–36.0)
MCV: 89.9 fL (ref 78.0–100.0)
Monocytes Absolute: 0.3 10*3/uL (ref 0.1–1.0)
Monocytes Relative: 5 % (ref 3–12)

## 2013-05-11 LAB — URINALYSIS, ROUTINE W REFLEX MICROSCOPIC
Bilirubin Urine: NEGATIVE
Specific Gravity, Urine: 1.025 (ref 1.005–1.030)
Urobilinogen, UA: 0.2 mg/dL (ref 0.0–1.0)

## 2013-05-11 LAB — BASIC METABOLIC PANEL
BUN: 29 mg/dL — ABNORMAL HIGH (ref 6–23)
Chloride: 99 mEq/L (ref 96–112)
Creatinine, Ser: 0.43 mg/dL — ABNORMAL LOW (ref 0.50–1.10)
GFR calc Af Amer: >90 mL/min (ref >90)
GFR calc non Af Amer: >90 mL/min (ref >90)

## 2013-05-11 LAB — URINE MICROSCOPIC-ADD ON

## 2013-05-11 MED ORDER — SODIUM CHLORIDE 0.9 % IV SOLN
Freq: Once | INTRAVENOUS | Status: AC
  Administered 2013-05-11: 09:00:00 via INTRAVENOUS

## 2013-05-11 MED ORDER — CEPHALEXIN 500 MG PO CAPS: 500.0000 mg | ORAL_CAPSULE | Freq: Four times a day (QID) | ORAL | Status: DC

## 2013-05-11 MED ORDER — HYDROCODONE-ACETAMINOPHEN 5-325 MG PO TABS
ORAL_TABLET | ORAL | Status: AC
  Administered 2013-05-11: 1 via ORAL
  Filled 2013-05-11: qty 1

## 2013-05-11 MED ORDER — HYDROCODONE-ACETAMINOPHEN 5-325 MG PO TABS: 1.0000 | ORAL_TABLET | Freq: Once | ORAL | Status: AC

## 2013-05-11 NOTE — ED Notes (Signed)
Patient came in with dry stool on her legs. Washed stool off legs and put a clean gown on patient.

## 2013-05-11 NOTE — ED Provider Notes (Signed)
History    This chart was scribed for Sara Lennert, MD by Leone Payor, ED Scribe. This patient was seen in room APA18/APA18 and the patient's care was started 8:26 AM.  CSN: [id number] Arrival date & time 05/11/13  0811  None    Chief Complaint  Patient presents with  . Fall  . Altered Mental Status    Patient is a 79 y.o. female presenting with fall. The history is provided by the patient (the pt states she fell to the floor and has knee pain).  Fall This is a new problem. The current episode started 1 to 2 hours ago. The problem occurs daily. The problem has been gradually worsening. Pertinent negatives include no chest pain. Nothing aggravates the symptoms. Nothing relieves the symptoms. She has tried nothing for the symptoms. The treatment provided no relief.    HPI Comments: Sara [name] is a 79 y.o. female brought in by ambulance, who presents to the Emergency Department complaining of a fall that occurred about 1 hour ago. Pt was found by daughter on the floor between her potty chair and wheelchair. Pt states she is having R knee pain, which is worse than the bilateral knee pain she has at baseline. States she has a h/o falls. Pt lives with her daughter. Pt has h/o HTN, scoliosis, arthritis. Pt has surgical h/o knee surgery.   Past Medical History  Diagnosis Date  . Hypertension   . Scoliosis   . Arthritis    Past Surgical History  Procedure Laterality Date  . Knee surgery     No family history on file. History  Substance Use Topics  . Smoking status: Never Smoker   . Smokeless tobacco: Not on file  . Alcohol Use: No   OB History   Grav Para Term Preterm Abortions TAB SAB Ect Mult Living                 Review of Systems  Constitutional: Negative for appetite change and fatigue.  HENT: Negative for congestion, sinus pressure and ear discharge.   Eyes: Negative for discharge.  Respiratory: Negative for cough.   Cardiovascular: Negative for chest pain.   Gastrointestinal: Negative for diarrhea.  Genitourinary: Negative for frequency and hematuria.  Musculoskeletal: Positive for joint swelling and arthralgias (knee pain). Negative for back pain.  Neurological: Negative for dizziness.    Allergies  Review of patient's allergies indicates no known allergies.  Home Medications   Current Outpatient Rx  Name  Route  Sig  Dispense  Refill  . cloNIDine (CATAPRES) 0.1 MG tablet   Oral   Take 1 tablet (0.1 mg total) by mouth 2 (two) times daily.   60 tablet      . HYDROcodone-acetaminophen (NORCO/VICODIN) 5-325 MG per tablet   Oral   Take 1 tablet by mouth every 4 (four) hours as needed. Pain.         Marland Kitchen oxyCODONE-acetaminophen (PERCOCET/ROXICET) 5-325 MG per tablet   Oral   Take 1 tablet by mouth every 6 (six) hours as needed for pain.   20 tablet   0    There were no vitals taken for this visit. Physical Exam  Nursing note and vitals reviewed. Constitutional: She is oriented to person, place, and time. She appears cachectic.  HENT:  Head: Normocephalic.  Mouth/Throat: Mucous membranes are dry.  Eyes: Conjunctivae and EOM are normal. No scleral icterus.  Neck: Neck supple. No thyromegaly present.  Cardiovascular: Normal rate and regular rhythm.  Exam reveals no gallop and no friction rub.   No murmur heard. Pulmonary/Chest: No stridor. She has no wheezes. She has no rales. She exhibits no tenderness.  Abdominal: She exhibits no distension. There is no tenderness. There is no rebound.  Musculoskeletal: Normal range of motion. She exhibits no edema.  Swelling to both knees but tenderness to R knee.   Lymphadenopathy:    She has no cervical adenopathy.  Neurological: She is oriented to person, place, and time. Coordination normal.  Skin: No rash noted. No erythema.  Psychiatric: She has a normal mood and affect. Her behavior is normal.    ED Course  Procedures (including critical care time)  DIAGNOSTIC STUDIES: Oxygen  Saturation is 96% on RA, adequate by my interpretation.    COORDINATION OF CARE: 8:35 AM Discussed treatment plan with pt at bedside and pt agreed to plan.   Labs Reviewed  CBC WITH DIFFERENTIAL - Abnormal; Notable for the following:    Hemoglobin 11.4 (*)    HCT 34.8 (*)    All other components within normal limits  BASIC METABOLIC PANEL - Abnormal; Notable for the following:    CO2 34 (*)    Glucose, Bld 101 (*)    BUN 29 (*)    Creatinine, Ser 0.43 (*)    All other components within normal limits  TROPONIN I   No results found. No diagnosis found.  MDM  The chart was scribed for me under my direct supervision.  I personally performed the history, physical, and medical decision making and all procedures in the evaluation of this patient.Sara Lennert, MD 05/11/13 1038

## 2013-05-11 NOTE — ED Notes (Signed)
Pt arrived by ems from home, pt is unsure of what occurred.  Pt was found by ems in the floor between potty chair and wheelchair,  Unsure how long she was in the floor, stool down pt's legs.  Pt is c/o bil knee pain, left shoulder pain.  Unsure of loc. Lives w/ daughter.

## 2013-05-11 NOTE — ED Notes (Signed)
The patient is unable to recall events from this morning, does not know how she wound up in the floor, family at bedside is unable to provide an accurate detail to events.

## 2013-05-13 LAB — URINE CULTURE: Colony Count: NO GROWTH

## 2013-06-17 ENCOUNTER — Emergency Department (HOSPITAL_COMMUNITY): Payer: Medicare Other

## 2013-06-17 ENCOUNTER — Encounter (HOSPITAL_COMMUNITY): Payer: Self-pay | Admitting: *Deleted

## 2013-06-17 ENCOUNTER — Emergency Department (HOSPITAL_COMMUNITY)
Admission: EM | Admit: 2013-06-17 | Discharge: 2013-06-17 | Disposition: A | Discharge status: Home / Self Care | Payer: Medicare Other | Attending: Emergency Medicine | Admitting: Emergency Medicine

## 2013-06-17 DIAGNOSIS — R0600 Dyspnea, unspecified: Secondary | ICD-10-CM

## 2013-06-17 DIAGNOSIS — R0602 Shortness of breath: Secondary | ICD-10-CM | POA: Insufficient documentation

## 2013-06-17 DIAGNOSIS — Z8739 Personal history of other diseases of the musculoskeletal system and connective tissue: Secondary | ICD-10-CM | POA: Insufficient documentation

## 2013-06-17 DIAGNOSIS — R0609 Other forms of dyspnea: Secondary | ICD-10-CM | POA: Insufficient documentation

## 2013-06-17 DIAGNOSIS — R0989 Other specified symptoms and signs involving the circulatory and respiratory systems: Secondary | ICD-10-CM | POA: Insufficient documentation

## 2013-06-17 DIAGNOSIS — I10 Essential (primary) hypertension: Secondary | ICD-10-CM | POA: Insufficient documentation

## 2013-06-17 DIAGNOSIS — M129 Arthropathy, unspecified: Secondary | ICD-10-CM | POA: Insufficient documentation

## 2013-06-17 LAB — BASIC METABOLIC PANEL
BUN: 16 mg/dL (ref 6–23)
CO2: 33 mEq/L — ABNORMAL HIGH (ref 19–32)
Chloride: 96 mEq/L (ref 96–112)
Creatinine, Ser: 0.35 mg/dL — ABNORMAL LOW (ref 0.50–1.10)
Glucose, Bld: 103 mg/dL — ABNORMAL HIGH (ref 70–99)

## 2013-06-17 LAB — URINALYSIS, ROUTINE W REFLEX MICROSCOPIC
Bilirubin Urine: NEGATIVE
Leukocytes, UA: NEGATIVE
Nitrite: NEGATIVE
Specific Gravity, Urine: >1.03 — ABNORMAL HIGH (ref 1.005–1.030)
pH: 5.5 (ref 5.0–8.0)

## 2013-06-17 LAB — CBC WITH DIFFERENTIAL/PLATELET
Basophils Absolute: 0 10*3/uL (ref 0.0–0.1)
HCT: 36.6 % (ref 36.0–46.0)
Hemoglobin: 12 g/dL (ref 12.0–15.0)
Lymphocytes Relative: 28 % (ref 12–46)
Lymphs Abs: 2 10*3/uL (ref 0.7–4.0)
Monocytes Absolute: 0.5 10*3/uL (ref 0.1–1.0)
Monocytes Relative: 7 % (ref 3–12)
Neutro Abs: 4.6 10*3/uL (ref 1.7–7.7)
RBC: 4.04 MIL/uL (ref 3.87–5.11)
WBC: 7.2 10*3/uL (ref 4.0–10.5)

## 2013-06-17 LAB — TROPONIN I: Troponin I: <0.3 ng/mL (ref <0.30)

## 2013-06-17 LAB — URINE MICROSCOPIC-ADD ON

## 2013-06-17 MED ORDER — IOHEXOL 350 MG/ML SOLN
75.0000 mL | Freq: Once | INTRAVENOUS | Status: AC | PRN
  Administered 2013-06-17: 75 mL via INTRAVENOUS

## 2013-06-17 NOTE — ED Notes (Signed)
Patient given discharge instruction, verbalized understand. IV removed, band aid applied. Patient in wheelchair  out of the department with daughter, granddaughter and pt advocate.

## 2013-06-17 NOTE — ED Notes (Signed)
Daughter stated she knew to give BP meds when they get home.

## 2013-06-17 NOTE — ED Notes (Signed)
chet pain with shortness of breath for a week or two

## 2013-06-17 NOTE — ED Provider Notes (Signed)
CSN: [id number]     Arrival date & time 06/17/13  1633 History  This chart was scribed for Sara Melter, MD by [name], ED Scribe. This patient was seen in room APA07/APA07 and the patient's care was started at 5:07 AM.   Chief Complaint  Patient presents with  . Chest Pain    The history is provided by a relative (daughter). No language interpreter was used.   HPI Comments: Sara [name] is a 79 y.o. female who presents to the Emergency Department complaining of CP with associated SOB. Daughter states that the pt had been c/o symptoms for the past week. She states that she noted the pt was having SOB 2 days ago. Family had a funeral and after-funeral gathering today and pt c/o of the same. Daughter states that the pt has spoken with PCP over SOB but not recently. She has been evaluated by respiratory who say that the pt has no pulmonary conditions. Pt is currently walking with a walker due to a recent fall. She is currently having PT for right leg and daughter reports improvement in ambulation.  Pt does not have a history of cardiac or pulmonary conditions. She is on pain medications and antihypertensives currently.   Past Medical History  Diagnosis Date  . Hypertension   . Scoliosis   . Arthritis    Past Surgical History  Procedure Laterality Date  . Knee surgery     No family history on file. History  Substance Use Topics  . Smoking status: Never Smoker   . Smokeless tobacco: Not on file  . Alcohol Use: No   No OB history provided.  Review of Systems  Respiratory: Positive for shortness of breath.   Cardiovascular: Positive for chest pain.  All other systems reviewed and are negative.    Allergies  Review of patient's allergies indicates no known allergies.  Home Medications   Current Outpatient Rx  Name  Route  Sig  Dispense  Refill  . cloNIDine (CATAPRES) 0.1 MG tablet   Oral   Take 1 tablet (0.1 mg total) by mouth 2 (two) times daily.   60  tablet      . HYDROcodone-acetaminophen (NORCO/VICODIN) 5-325 MG per tablet   Oral   Take 1 tablet by mouth every 4 (four) hours as needed. Pain.         Marland Kitchen ibuprofen (ADVIL,MOTRIN) 200 MG tablet   Oral   Take 200 mg by mouth every 6 (six) hours as needed for pain.          Triage Vitals: BP 165/112  Pulse 116  Temp(Src) 98 F (36.7 C) (Oral)  Resp 22  Ht 4\' 11"  (1.499 m)  Wt 75 lb (34.02 kg)  BMI 15.14 kg/m2  SpO2 98%  Physical Exam  Nursing note and vitals reviewed. Constitutional: She is oriented to person, place, and time.  frail and elderly   HENT:  Head: Normocephalic and atraumatic.  Eyes: Conjunctivae and EOM are normal. Pupils are equal, round, and reactive to light.  Neck: Normal range of motion and phonation normal. Neck supple.  Cardiovascular: Regular rhythm and intact distal pulses.  Tachycardia present.   Pulmonary/Chest: Breath sounds normal. Tachypnea noted. She has no wheezes. She has no rales. She exhibits no tenderness.  Abdominal: Soft. She exhibits no distension. There is no tenderness. There is no guarding.  Musculoskeletal: Normal range of motion. She exhibits no edema.  Right knee is swollen and pt resists flexion secondary to pain,  severe thoracic kyphosis   Neurological: She is alert and oriented to person, place, and time. She has normal strength. She exhibits normal muscle tone.  Skin: Skin is warm and dry.  Psychiatric: She has a normal mood and affect. Her behavior is normal.    ED Course   Procedures (including critical care time)  Medications  iohexol (OMNIPAQUE) 350 MG/ML injection 75 mL (75 mLs Intravenous Contrast Given 06/17/13 1926)    Patient Vitals for the past 24 hrs:  BP Temp Temp src Pulse Resp SpO2 Height Weight  06/17/13 2100 180/129 mmHg - - - 22 - - -  06/17/13 2036 164/108 mmHg - - 103 23 95 % - -  06/17/13 2000 168/112 mmHg - - 102 26 96 % - -  06/17/13 1900 157/114 mmHg - - 105 22 96 % - -  06/17/13 1840 159/104  mmHg - - 106 27 96 % - -  06/17/13 1800 176/127 mmHg - - - 22 - - -  06/17/13 1643 165/112 mmHg 98 F (36.7 C) Oral 116 22 98 % 4\' 11"  (1.499 m) 75 lb (34.02 kg)    DIAGNOSTIC STUDIES: Oxygen Saturation is 94% on adequate, normal by my interpretation.    COORDINATION OF CARE: 5:13 PM-Discussed treatment plan which includes CXR, CBC panel, BMP and d-dimer with pt at bedside and pt agreed to plan.  6:36 PM-Pt rechecked and is resting comfortably. BP is 176/127. Daughter states that the pt has not had her BP medications today. Advised pt and daughter that I am awaiting the results of a few lab tests. Will order BP medications with daughter and pt's permission. 8:56 PM-BP is 168/108. Pt is resting comfortably. Informed pt of radiology and lab work results. Discussed discharge plan which includes taking BP medications with pt and pt agreed to plan. Also advised pt to follow up as needed and pt agreed. Addressed symptoms to return for with pt.    Date: 06/17/2013  Rate: 110  Rhythm: sinus tachycardia  QRS Axis: left  Intervals: normal  ST/T Wave abnormalities: normal  Conduction Disutrbances:right bundle branch block  Narrative Interpretation:   Old EKG Reviewed: unchanged   Labs Reviewed  BASIC METABOLIC PANEL - Abnormal; Notable for the following:    CO2 33 (*)    Glucose, Bld 103 (*)    Creatinine, Ser 0.35 (*)    All other components within normal limits  URINALYSIS, ROUTINE W REFLEX MICROSCOPIC - Abnormal; Notable for the following:    Specific Gravity, Urine >1.030 (*)    Protein, ur 30 (*)    All other components within normal limits  D-DIMER, QUANTITATIVE - Abnormal; Notable for the following:    D-Dimer, Quant 1.25 (*)    All other components within normal limits  URINE MICROSCOPIC-ADD ON - Abnormal; Notable for the following:    Casts HYALINE CASTS (*)    Crystals CA OXALATE CRYSTALS (*)    All other components within normal limits  CBC WITH DIFFERENTIAL  TROPONIN I    Dg Chest 2 View  06/17/2013   *RADIOLOGY REPORT*  Clinical Data: Chest pain and shortness of breath.  CHEST - 2 VIEW  Comparison: Chest x-ray 05/11/2013.  Findings: The patient is extremely kyphotic and the frontal projection is rotated to the left causing distortion of normal anatomy.  With these limitations in mind, there is no definite acute consolidative airspace disease and no pleural effusions.  No evidence of pulmonary edema.  Extensive bilateral apical nodular pleural parenchymal thickening,  similar to prior examinations, most compatible with chronic pleural parenchymal scarring.  Probable complete chronic collapse of the right middle lobe again noted (better demonstrated on prior chest CT 07/29/2010).  No definite suspicious appearing pulmonary nodules or masses.  No evidence of pulmonary edema.  Heart size is normal.  Tortuosity and atherosclerosis of the thoracic aorta.  Osteopenia with multiple vertebral body compression fractures, similar to the prior examination.  IMPRESSION: 1.  No definite radiographic evidence of acute cardiopulmonary disease. 2.  Atherosclerosis. 3.  Osteopenia with multiple vertebral body compression fractures. Per CMS PQRS reporting requirements (PQRS Measure 24): Given the patient's age of greater than 50 and the fracture site (hip, distal radius, or spine), the patient should be tested for osteoporosis using DXA, and the appropriate treatment considered based on the DXA results.   Original Report Authenticated By: Trudie Reed, M.D.   Ct Angio Chest Pe W/cm &/or Wo Cm  06/17/2013   *RADIOLOGY REPORT*  Clinical Data: Chest pain and shortness of breath.  Tachycardia.  CT ANGIOGRAPHY CHEST  Technique:  Multidetector CT imaging of the chest using the standard protocol during bolus administration of intravenous contrast. Multiplanar reconstructed images including MIPs were obtained and reviewed to evaluate the vascular anatomy.  Contrast: 75mL OMNIPAQUE IOHEXOL 350 MG/ML  SOLN  Comparison: Chest x-ray dated 06/17/2013 and CT angiogram of the chest dated 07/29/2010  Findings: There are no pulmonary emboli.  The patient has numerous thoracic compression fractures including T4, T6, T8, T10, T12, as well as a fracture of L1.  The patient has bronchiectasis with mucus plugs fillings in the peripheral bronchi.  There is slight atelectasis in the right middle lobe and in the lingula.  There is chronic cardiomegaly with tortuosity of the thoracic aorta.  There are no effusions.  No acute abnormality of the visualized portion of the upper abdomen.  IMPRESSION:  1.  No acute pulmonary emboli. 2.  Numerous thoracic compression fractures of the new since the prior CT scan dated 07/29/2010. 3.  Bronchiectasis with distal mucus plugs bilaterally.   Original Report Authenticated By: Francene Boyers, M.D.   1. Dyspnea     MDM  Shortness of breath without chest pain or back pain. Incidental finding of compression fractures on CT scan; these are not likely acute. There is no evidence for PE, pneumonia or pulmonary tumor. Doubt metabolic instability, serious bacterial infection or impending vascular collapse; the patient is stable for discharge.  Nursing Notes Reviewed/ Care Coordinated, and agree without changes. Applicable Imaging Reviewed.  Interpretation of Laboratory Data incorporated into ED treatment   Plan: Home Medications- usual; Home Treatments and Observation- rest, watch for progressive symptoms; return here if the recommended treatment, does not improve the symptoms; Recommended follow up- PCP, for check up in 3 days.    I personally performed the services described in this documentation, which was scribed in my presence. The recorded information has been reviewed and is accurate.        Sara Melter, MD 06/18/13 [phone]

## 2013-06-17 NOTE — ED Notes (Signed)
Assisted pt to bedside commode. Pt is very slow and scared to move. Takes a lot of reassuring that pt is safe.   Family at the bed side.

## 2013-08-13 ENCOUNTER — Emergency Department (HOSPITAL_COMMUNITY): Payer: Medicare Other

## 2013-08-13 ENCOUNTER — Emergency Department (HOSPITAL_COMMUNITY)
Admission: EM | Admit: 2013-08-13 | Discharge: 2013-08-13 | Disposition: A | Discharge status: Home / Self Care | Payer: Medicare Other | Attending: Emergency Medicine | Admitting: Emergency Medicine

## 2013-08-13 ENCOUNTER — Encounter (HOSPITAL_COMMUNITY): Payer: Self-pay

## 2013-08-13 DIAGNOSIS — R0602 Shortness of breath: Secondary | ICD-10-CM

## 2013-08-13 DIAGNOSIS — J441 Chronic obstructive pulmonary disease with (acute) exacerbation: Secondary | ICD-10-CM | POA: Insufficient documentation

## 2013-08-13 DIAGNOSIS — I10 Essential (primary) hypertension: Secondary | ICD-10-CM | POA: Insufficient documentation

## 2013-08-13 DIAGNOSIS — J449 Chronic obstructive pulmonary disease, unspecified: Secondary | ICD-10-CM

## 2013-08-13 DIAGNOSIS — M129 Arthropathy, unspecified: Secondary | ICD-10-CM | POA: Insufficient documentation

## 2013-08-13 DIAGNOSIS — Z79899 Other long term (current) drug therapy: Secondary | ICD-10-CM | POA: Insufficient documentation

## 2013-08-13 LAB — COMPREHENSIVE METABOLIC PANEL
ALT: 10 U/L (ref 0–35)
AST: 16 U/L (ref 0–37)
Calcium: 9.5 mg/dL (ref 8.4–10.5)
Sodium: 145 mEq/L (ref 135–145)
Total Protein: 6.6 g/dL (ref 6.0–8.3)

## 2013-08-13 LAB — URINALYSIS, ROUTINE W REFLEX MICROSCOPIC
Bilirubin Urine: NEGATIVE
Hgb urine dipstick: NEGATIVE
Nitrite: NEGATIVE
Specific Gravity, Urine: 1.015 (ref 1.005–1.030)
pH: 8 (ref 5.0–8.0)

## 2013-08-13 LAB — CBC WITH DIFFERENTIAL/PLATELET
Basophils Absolute: 0 10*3/uL (ref 0.0–0.1)
Eosinophils Absolute: 0 10*3/uL (ref 0.0–0.7)
Eosinophils Relative: 0 % (ref 0–5)
MCH: 29.8 pg (ref 26.0–34.0)
MCV: 91.6 fL (ref 78.0–100.0)
Platelets: 228 10*3/uL (ref 150–400)
RDW: 13.9 % (ref 11.5–15.5)
WBC: 7.2 10*3/uL (ref 4.0–10.5)

## 2013-08-13 LAB — TROPONIN I: Troponin I: <0.3 ng/mL (ref <0.30)

## 2013-08-13 MED ORDER — ALBUTEROL SULFATE HFA 108 (90 BASE) MCG/ACT IN AERS
4.0000 | INHALATION_SPRAY | Freq: Once | RESPIRATORY_TRACT | Status: AC
  Administered 2013-08-13: 4 via RESPIRATORY_TRACT
  Filled 2013-08-13: qty 6.7

## 2013-08-13 MED ORDER — CLONIDINE HCL 0.1 MG PO TABS
0.1000 mg | ORAL_TABLET | Freq: Three times a day (TID) | ORAL | Status: DC
  Administered 2013-08-13: 0.1 mg via ORAL
  Filled 2013-08-13: qty 1

## 2013-08-13 MED ORDER — PREDNISONE 50 MG PO TABS
60.0000 mg | ORAL_TABLET | Freq: Once | ORAL | Status: AC
  Administered 2013-08-13: 60 mg via ORAL
  Filled 2013-08-13 (×2): qty 1

## 2013-08-13 MED ORDER — PREDNISONE 10 MG PO TABS: 50.0000 mg | ORAL_TABLET | Freq: Every day | ORAL | Status: DC

## 2013-08-13 NOTE — ED Notes (Signed)
Childs cuff put on patient BP still 174/115. Family state patient takes blood pressure medicine three times a day and has not taken any today. RN made aware.

## 2013-08-13 NOTE — ED Notes (Signed)
Pt reports sob for several weeks, feels worse today, +dry cough, mild swelling of her feet. Mild cp that she stated comes and goes. Has also been having low back pain. Lives w/ her daughter

## 2013-08-13 NOTE — ED Notes (Signed)
Patient is resting comfortably. 

## 2013-08-13 NOTE — ED Provider Notes (Signed)
CSN: [id number]     Arrival date & time 08/13/13  1331 History  This chart was scribed for Candyce Churn, MD by Quintella Reichert, ED scribe.  This patient was seen in room APA09/APA09 and the patient's care was started at 2:33 PM.  Chief Complaint  Patient presents with  . Shortness of Breath    Patient is a 79 y.o. female presenting with shortness of breath. The history is provided by the patient. No language interpreter was used.  Shortness of Breath Severity:  Moderate Onset quality:  Gradual Duration: Over one month. Relieved by:  Lying down Worsened by:  Activity Associated symptoms: no abdominal pain, no chest pain, no cough, no fever and no sore throat     HPI Comments: Sara [name] is a 79 y.o. female with h/o osteoporosis, scoliosis who presents to the Emergency Department complaining of waxing-and-waning SOB that has been present for at least one month.  Pt has no h/o known cardiac or pulmonary conditions and states she feels that her SOB is caused by "I'm not breathing the way I should be."  SOB may be exacerbated by exertion and is relieved slightly by lying on her back.  She denies CP, cough, fever, congestion, sore throat, abdominal pain, or any other associated symptoms.  Pt was seen in the ED 2 months ago for CP and SOB.  Cardiac workup at that visit was unremarkable although imaging incidentally revealed multiple spinal fractures.  She has not been evaluated by her PCP recently.    Past Medical History  Diagnosis Date  . Hypertension   . Scoliosis   . Arthritis     Past Surgical History  Procedure Laterality Date  . Knee surgery      No family history on file.   History  Substance Use Topics  . Smoking status: Never Smoker   . Smokeless tobacco: Not on file  . Alcohol Use: No    OB History   Grav Para Term Preterm Abortions TAB SAB Ect Mult Living                  Review of Systems  Constitutional: Negative for fever.  HENT: Negative for  sore throat.   Respiratory: Positive for shortness of breath. Negative for cough.   Cardiovascular: Negative for chest pain.  Gastrointestinal: Negative for abdominal pain.  All other systems reviewed and are negative.     Allergies  Review of patient's allergies indicates no known allergies.  Home Medications   Current Outpatient Rx  Name  Route  Sig  Dispense  Refill  . cloNIDine (CATAPRES) 0.1 MG tablet   Oral   Take 1 tablet (0.1 mg total) by mouth 2 (two) times daily.   60 tablet      . HYDROcodone-acetaminophen (NORCO/VICODIN) 5-325 MG per tablet   Oral   Take 1 tablet by mouth every 4 (four) hours as needed. Pain.         Marland Kitchen ibuprofen (ADVIL,MOTRIN) 200 MG tablet   Oral   Take 200 mg by mouth every 6 (six) hours as needed for pain.          BP 173/117  Pulse 90  Temp(Src) 97.6 F (36.4 C) (Oral)  Resp 40  Ht 4\' 11"  (1.499 m)  Wt 77 lb (34.927 kg)  BMI 15.54 kg/m2  SpO2 94%  Physical Exam  Nursing note and vitals reviewed. Constitutional: She is oriented to person, place, and time. She appears well-developed and well-nourished. No  distress.  HENT:  Head: Normocephalic and atraumatic.  Mouth/Throat: Oropharynx is clear and moist.  Eyes: Conjunctivae are normal. Pupils are equal, round, and reactive to light. No scleral icterus.  Neck: Neck supple.  Cardiovascular: Normal rate, regular rhythm, normal heart sounds and intact distal pulses.   No murmur heard. Pulmonary/Chest: Effort normal and breath sounds normal. No stridor. No respiratory distress. She has no wheezes. She has no rales.  Abdominal: Soft. Bowel sounds are normal. She exhibits no distension. There is no tenderness.  Musculoskeletal: Normal range of motion. She exhibits no edema.  Severely kyphotic No lower extremity edema  Neurological: She is alert and oriented to person, place, and time.  Skin: Skin is warm and dry. No rash noted.  Psychiatric: She has a normal mood and affect. Her  behavior is normal.    ED Course  Procedures (including critical care time)  DIAGNOSTIC STUDIES: Oxygen Saturation is 94% on room air, adequate by my interpretation.    COORDINATION OF CARE: 2:39 PM-Discussed treatment plan which includes CXR and labs with pt at bedside and pt agreed to plan.    Labs Review Labs Reviewed  CBC WITH DIFFERENTIAL - Abnormal; Notable for the following:    Hemoglobin 11.7 (*)    HCT 35.9 (*)    All other components within normal limits  COMPREHENSIVE METABOLIC PANEL - Abnormal; Notable for the following:    CO2 38 (*)    Creatinine, Ser 0.39 (*)    Alkaline Phosphatase 119 (*)    All other components within normal limits  URINALYSIS, ROUTINE W REFLEX MICROSCOPIC - Abnormal; Notable for the following:    APPearance CLOUDY (*)    All other components within normal limits  PRO B NATRIURETIC PEPTIDE  TROPONIN I    Imaging Review Dg Chest 2 View  08/13/2013   CLINICAL DATA:  Shortness of breath.  EXAM: CHEST  2 VIEW  COMPARISON:  06/17/2013  FINDINGS: Heart size is normal. There is severe accentuation of the thoracic kyphosis with multiple compression fractures and diffuse osteopenia, stable. Marked tortuosity of the thoracic aorta. COPD. No acute infiltrates or effusions. Bilateral apical pleural thickening, stable.  IMPRESSION: No acute abnormality. COPD.   Electronically Signed   By: Geanie Cooley   On: 08/13/2013 16:01  All radiology studies independently viewed by me.     EKG - sinus rhythm with PACs, left axis deviation, QRSd 106 ms, no ST/T wave changes, compared to prior PACs present  MDM   1. Shortness of breath   2. COPD (chronic obstructive pulmonary disease)    Nontoxic 79 year old female with several months of shortness of breath. Denies other symptoms including chest pain.    Workup indicates shortness of breath is likely secondary to COPD. She remained somewhat tachypneic during her ED course, but did improve some after albuterol and  prednisone. She was not hypoxic on room air and did not become hypoxic upon ambulation. She had normal mental status, was moving air bilaterally with no wheezing, had chronically elevated bicarbonate, and did not want to be admitted to the hospital.  Advised close PCP followup, followup with pulmonologist, and a strict return precautions.  I personally performed the services described in this documentation, which was scribed in my presence. The recorded information has been reviewed and is accurate.     Candyce Churn, MD 08/13/13 2122

## 2013-08-13 NOTE — ED Notes (Signed)
MD at bedside. 

## 2013-08-13 NOTE — ED Notes (Signed)
Assisted patient with ambulating in bed O2 sat was 96% room air patient walked to the door and felt short of breath. O2 sat dropped to 92%. Patient assisted back into the bed. MD made aware.

## 2013-08-13 NOTE — ED Notes (Signed)
Pt reports becoming weaker and less able to walk without assistance over past 2 weeks with worsening SOB. BBS diminished in lower lobes with expiratory wheezing noted. Tachypnea noted at times. Sats 95 %  No oxygen therapy used at home, does use pro-air inhaler.   No acute distress noted. Pt reports does not ambulate much on her own at this time. Pt states use to use walker but is not able to at this time.  Pt to be placed on monitor

## 2013-08-13 NOTE — ED Notes (Addendum)
Pt ambulated to bathroom with 1 assist and the aid of a walker, SAO2 maintained greater than 92 %.  Pt HR 114 during ambulation.  Pt returned to bed after return ambulation, no increased work of breathing noted. SAO2 stable in mid 90's and HR decreased to 90 upon resting in bed a full minute. Pt able to talk to family members without SOB during and after ambulation.  EDP notified of findings.

## 2013-11-16 ENCOUNTER — Encounter (HOSPITAL_COMMUNITY): Payer: Self-pay | Admitting: Emergency Medicine

## 2013-11-16 ENCOUNTER — Emergency Department (HOSPITAL_COMMUNITY)
Admission: EM | Admit: 2013-11-16 | Discharge: 2013-11-16 | Disposition: A | Discharge status: Home / Self Care | Payer: Medicare Other | Attending: Emergency Medicine | Admitting: Emergency Medicine

## 2013-11-16 ENCOUNTER — Emergency Department (HOSPITAL_COMMUNITY): Payer: Medicare Other

## 2013-11-16 DIAGNOSIS — IMO0002 Reserved for concepts with insufficient information to code with codable children: Secondary | ICD-10-CM | POA: Insufficient documentation

## 2013-11-16 DIAGNOSIS — S1093XA Contusion of unspecified part of neck, initial encounter: Principal | ICD-10-CM

## 2013-11-16 DIAGNOSIS — Y939 Activity, unspecified: Secondary | ICD-10-CM | POA: Insufficient documentation

## 2013-11-16 DIAGNOSIS — S0003XA Contusion of scalp, initial encounter: Secondary | ICD-10-CM | POA: Insufficient documentation

## 2013-11-16 DIAGNOSIS — T148XXA Other injury of unspecified body region, initial encounter: Secondary | ICD-10-CM

## 2013-11-16 DIAGNOSIS — Z79899 Other long term (current) drug therapy: Secondary | ICD-10-CM | POA: Insufficient documentation

## 2013-11-16 DIAGNOSIS — Y929 Unspecified place or not applicable: Secondary | ICD-10-CM | POA: Insufficient documentation

## 2013-11-16 DIAGNOSIS — Z8739 Personal history of other diseases of the musculoskeletal system and connective tissue: Secondary | ICD-10-CM | POA: Insufficient documentation

## 2013-11-16 DIAGNOSIS — W1809XA Striking against other object with subsequent fall, initial encounter: Secondary | ICD-10-CM | POA: Insufficient documentation

## 2013-11-16 DIAGNOSIS — M129 Arthropathy, unspecified: Secondary | ICD-10-CM | POA: Insufficient documentation

## 2013-11-16 DIAGNOSIS — S0083XA Contusion of other part of head, initial encounter: Principal | ICD-10-CM

## 2013-11-16 DIAGNOSIS — I10 Essential (primary) hypertension: Secondary | ICD-10-CM | POA: Insufficient documentation

## 2013-11-16 DIAGNOSIS — W010XXA Fall on same level from slipping, tripping and stumbling without subsequent striking against object, initial encounter: Secondary | ICD-10-CM | POA: Insufficient documentation

## 2013-11-16 NOTE — ED Provider Notes (Signed)
CSN: [id number]     Arrival date & time 11/16/13  1030 History  This chart was scribed for Gilda Crease, MD by [name], ED Scribe. This patient was seen in room APA12/APA12 and the patient's care was started at 10:34 AM.   Chief Complaint  Patient presents with  . Fall    The history is provided by the patient. No language interpreter was used.    HPI Comments: Sara [name] is a 80 y.o. female brought in by ambulance from home, who presents to the Emergency Department complaining of a trip and fall over her walker this morning. She states that she fell and hit her head but denies LOC. She reports a mild HA and abrasion above the right eye as associated symptoms. She states she has been able to ambulate since the fall without any noted weakness or numbness.    Past Medical History  Diagnosis Date  . Hypertension   . Scoliosis   . Arthritis    Past Surgical History  Procedure Laterality Date  . Knee surgery     No family history on file. History  Substance Use Topics  . Smoking status: Never Smoker   . Smokeless tobacco: Not on file  . Alcohol Use: No   No OB history provided.  Review of Systems  Musculoskeletal: Negative for back pain and neck pain.  Skin: Positive for wound.  Neurological: Positive for headaches. Negative for syncope.  All other systems reviewed and are negative.    Allergies  Review of patient's allergies indicates no known allergies.  Home Medications   Current Outpatient Rx  Name  Route  Sig  Dispense  Refill  . albuterol (PROAIR HFA) 108 (90 BASE) MCG/ACT inhaler   Inhalation   Inhale 2 puffs into the lungs every 6 (six) hours as needed for wheezing or shortness of breath.         . cloNIDine (CATAPRES) 0.1 MG tablet   Oral   Take 0.1 mg by mouth 3 (three) times daily.         Marland Kitchen HYDROcodone-acetaminophen (NORCO/VICODIN) 5-325 MG per tablet   Oral   Take 1 tablet by mouth every 4 (four) hours as needed. Pain.          Marland Kitchen ibuprofen (ADVIL,MOTRIN) 200 MG tablet   Oral   Take 200 mg by mouth every 6 (six) hours as needed for pain.         . predniSONE (DELTASONE) 10 MG tablet   Oral   Take 5 tablets (50 mg total) by mouth daily.   20 tablet   0    There were no vitals taken for this visit.  Physical Exam  Nursing note and vitals reviewed. Constitutional: She is oriented to person, place, and time. She appears well-developed. No distress.  Frail and elderly   HENT:  Head: Normocephalic.  Right Ear: Hearing normal.  Left Ear: Hearing normal.  Contusion and abrasion above the right eye  Eyes: Conjunctivae and EOM are normal. Pupils are equal, round, and reactive to light.  Neck: Normal range of motion. Neck supple.  Cardiovascular: Normal rate, regular rhythm, S1 normal and S2 normal.  Exam reveals no gallop and no friction rub.   No murmur heard. Pulmonary/Chest: Effort normal and breath sounds normal. No respiratory distress. She exhibits tenderness.    Abdominal: Soft. Normal appearance and bowel sounds are normal. There is no hepatosplenomegaly. There is no tenderness. There is no rebound, no guarding, no tenderness  at McBurney's point and negative Murphy's sign. No hernia.  Musculoskeletal: Normal range of motion.  Neurological: She is alert and oriented to person, place, and time. She has normal strength. No cranial nerve deficit or sensory deficit. Coordination normal. GCS eye subscore is 4. GCS verbal subscore is 5. GCS motor subscore is 6.  Skin: Skin is warm, dry and intact. No rash noted. No cyanosis.  Psychiatric: She has a normal mood and affect. Her speech is normal and behavior is normal. Thought content normal.    ED Course  Procedures (including critical care time)  DIAGNOSTIC STUDIES:  COORDINATION OF CARE: 10:38 AM-Discussed treatment plan which includes x-rays of the pelvis and chest and CT scans of head and c-spine with pt at bedside and pt agreed to plan.    Labs Review Labs Reviewed - No data to display Imaging Review Dg Chest 1 View  11/16/2013   CLINICAL DATA:  History of fall.  Central chest pain.  EXAM: CHEST - 1 VIEW  COMPARISON:  Chest x-ray 08/13/2013.  FINDINGS: Extensive bilateral apical pleural parenchymal thickening, similar to prior studies, most compatible with chronic post infectious or inflammatory scarring. Mild diffuse interstitial prominence appears similar to prior examinations. No definite acute consolidative airspace disease. No pleural effusions. Heart size appears normal. Mediastinal contours are remarkable for atherosclerosis and tortuosity of the thoracic aorta, similar prior studies.  IMPRESSION: 1. The overall appearance the chest is similar to prior examinations demonstrating multiple chronic changes, as above. No definite radiographic evidence of acute cardiopulmonary disease on this portable examination.   Electronically Signed   By: Trudie Reed M.D.   On: 11/16/2013 12:17   Dg Pelvis 1-2 Views  11/16/2013   CLINICAL DATA:  Recent fall.  EXAM: PELVIS - 1-2 VIEW  COMPARISON:  No priors.  FINDINGS: Three views of the pelvis are limited by osteopenia in this patient. Postoperative changes of dynamic compression screw fixation are noted in the right hip. No periprosthetic fracture. The pelvis is poorly visualized secondary to underpenetration of the film and severe osteopenia. With these limitations in mind, no definite acute displaced pelvic fractures are identified. Femoral heads appear to project within the acetabulae bilaterally.  IMPRESSION: 1. Limited study demonstrating no definite acute radiographic abnormality of the bony pelvis. 2. Status post ORIF in the right hip.   Electronically Signed   By: Trudie Reed M.D.   On: 11/16/2013 12:32   Ct Head Wo Contrast  11/16/2013   CLINICAL DATA:  History of trauma from a fall. Small laceration around the right eye. Weakness and altered level of consciousness.  EXAM: CT HEAD  WITHOUT CONTRAST  CT CERVICAL SPINE WITHOUT CONTRAST  TECHNIQUE: Multidetector CT imaging of the head and cervical spine was performed following the standard protocol without intravenous contrast. Multiplanar CT image reconstructions of the cervical spine were also generated.  COMPARISON:  No priors.  FINDINGS: CT HEAD FINDINGS  Multiple well-defined foci of low attenuation in the subinsular regions and basal ganglia bilaterally, compatible with old lacunar infarctions. Patchy and confluent areas of decreased attenuation are noted throughout the deep and periventricular white matter of the cerebral hemispheres bilaterally, compatible with chronic microvascular ischemic disease. No acute displaced skull fractures are identified. No acute intracranial abnormality. Specifically, no evidence of acute post-traumatic intracranial hemorrhage, no definite regions of acute/subacute cerebral ischemia, no focal mass, mass effect, hydrocephalus or abnormal intra or extra-axial fluid collections. The visualized paranasal sinuses and mastoids are well pneumatized.  CT CERVICAL SPINE FINDINGS  Compared to prior chest CT 06/17/2013 there is new compression of the superior endplate of T3 with approximately 10% loss of anterior vertebral body height and 25% loss of posterior vertebral body height. Old compression fracture T4 appears unchanged. Within the cervical spine itself there is no acute displaced fracture. Exaggeration of normal cervical lordosis. Alignment is otherwise anatomic. Multilevel degenerative disc disease, most severe at C6-C7. Severe multilevel facet arthropathy. Prevertebral soft tissues are normal.  IMPRESSION: 1. No evidence of significant acute traumatic injury to the head, brain or cervical spine. 2. However, there is a new compression fracture of T3 when compared to prior study 06/17/2013, as discussed above. Whether this represents an acute injury, or something that happened in the recent several months is  uncertain on today's CT examination. Clinical correlation is recommended. 3. Old compression fracture of T4 is unchanged. 4. Multilevel degenerative disc disease and cervical spondylosis. 5. Multifocal lacunar infarctions throughout the basal ganglia and subinsular regions bilaterally, and extensive chronic microvascular ischemic changes in the cerebral white matter.   Electronically Signed   By: Trudie Reedaniel  Entrikin M.D.   On: 11/16/2013 11:41   Ct Cervical Spine Wo Contrast  11/16/2013   CLINICAL DATA:  History of trauma from a fall. Small laceration around the right eye. Weakness and altered level of consciousness.  EXAM: CT HEAD WITHOUT CONTRAST  CT CERVICAL SPINE WITHOUT CONTRAST  TECHNIQUE: Multidetector CT imaging of the head and cervical spine was performed following the standard protocol without intravenous contrast. Multiplanar CT image reconstructions of the cervical spine were also generated.  COMPARISON:  No priors.  FINDINGS: CT HEAD FINDINGS  Multiple well-defined foci of low attenuation in the subinsular regions and basal ganglia bilaterally, compatible with old lacunar infarctions. Patchy and confluent areas of decreased attenuation are noted throughout the deep and periventricular white matter of the cerebral hemispheres bilaterally, compatible with chronic microvascular ischemic disease. No acute displaced skull fractures are identified. No acute intracranial abnormality. Specifically, no evidence of acute post-traumatic intracranial hemorrhage, no definite regions of acute/subacute cerebral ischemia, no focal mass, mass effect, hydrocephalus or abnormal intra or extra-axial fluid collections. The visualized paranasal sinuses and mastoids are well pneumatized.  CT CERVICAL SPINE FINDINGS  Compared to prior chest CT 06/17/2013 there is new compression of the superior endplate of T3 with approximately 10% loss of anterior vertebral body height and 25% loss of posterior vertebral body height. Old  compression fracture T4 appears unchanged. Within the cervical spine itself there is no acute displaced fracture. Exaggeration of normal cervical lordosis. Alignment is otherwise anatomic. Multilevel degenerative disc disease, most severe at C6-C7. Severe multilevel facet arthropathy. Prevertebral soft tissues are normal.  IMPRESSION: 1. No evidence of significant acute traumatic injury to the head, brain or cervical spine. 2. However, there is a new compression fracture of T3 when compared to prior study 06/17/2013, as discussed above. Whether this represents an acute injury, or something that happened in the recent several months is uncertain on today's CT examination. Clinical correlation is recommended. 3. Old compression fracture of T4 is unchanged. 4. Multilevel degenerative disc disease and cervical spondylosis. 5. Multifocal lacunar infarctions throughout the basal ganglia and subinsular regions bilaterally, and extensive chronic microvascular ischemic changes in the cerebral white matter.   Electronically Signed   By: Trudie Reedaniel  Entrikin M.D.   On: 11/16/2013 11:41    EKG Interpretation    Date/Time:  Sunday November 16 2013 10:37:01 EST Ventricular Rate:  94 PR Interval:  146 QRS Duration: 116 QT  Interval:  398 QTC Calculation: 497 R Axis:   55 Text Interpretation:  Normal sinus rhythm Right bundle branch block Anterior infarct , age undetermined Abnormal ECG No significant change since last tracing Confirmed by POLLINA  MD, CHRISTOPHER ([id number]) on 11/16/2013 10:50:10 AM            MDM  Diagnosis: Facial contusion  Patient presents to ER after a fall. Patient reports that she had a mechanical fall after she slipped. She did hit the right side of her face on the ground, no loss of consciousness. Patient had a small contusion and abrasion. Her only complaint was pain around the area of the right eye. Evaluation of the patient's cervical spine revealed no evidence of tenderness, but based on  her EEG and the fall, CT of head and cervical spine were performed. No abnormality seen in the cervical spine but there is what appears to be a new T3 fracture, mild, compared to previous x-rays. The patient has not, however, it experiencing any pain in this area. I did repeat my evaluation and there is ligament tenderness in this area.  Have some tenderness in the anterior chest region. This started after the fall, was not present prior to the fall. She has no crepitance and chest x-ray is unremarkable. An EKG was performed as well, and no change from previous. I do not suspect cardiac etiology, this is musculoskeletal and secondary to the fall.  I had a lengthy conversation with the family. Patient has had multiple falls in the past. She has had some generalized weakness and progressive decline secondary to age. They're starting to consider that her living arrangement will be changed. I did explain to them that although she is weak, I do not have any diagnosis that would require admission. She will therefore be referred back to her primary care doctor to evaluate to see if she has any possibility of in-home aides, nursing, physical therapy or the possibility of placement.   I personally performed the services described in this documentation, which was scribed in my presence. The recorded information has been reviewed and is accurate.    Gilda Crease, MD 11/16/13 [phone]

## 2013-11-16 NOTE — ED Notes (Signed)
Pt family member reports that pt doesn't remember the ride to the ED but that pt is at baseline mentation and verbals. Pt family reports that pt stays with her daughter and that the pt was walking down the hall and tripped and fell. Pt gone to xray will assess upon return.

## 2013-11-16 NOTE — ED Notes (Signed)
Pt complain of some chest pain since falling

## 2013-11-16 NOTE — ED Notes (Signed)
Pt states she slipped on something in the floor, fell and hit her head. Pt has abrasion above right eye

## 2013-11-16 NOTE — ED Notes (Signed)
Pt requesting something to drink, advised that Dr. Blinda LeatherwoodPollina would need to exam her xray results before being allowed something to drink, pt and family expressed understanding.

## 2013-11-16 NOTE — Discharge Instructions (Signed)
Abrasion °An abrasion is a cut or scrape of the skin. Abrasions do not extend through all layers of the skin and most heal within 10 days. It is important to care for your abrasion properly to prevent infection. °CAUSES  °Most abrasions are caused by falling on, or gliding across, the ground or other surface. When your skin rubs on something, the outer and inner layer of skin rubs off, causing an abrasion. °DIAGNOSIS  °Your caregiver will be able to diagnose an abrasion during a physical exam.  °TREATMENT  °Your treatment depends on how large and deep the abrasion is. Generally, your abrasion will be cleaned with water and a mild soap to remove any dirt or debris. An antibiotic ointment may be put over the abrasion to prevent an infection. A bandage (dressing) may be wrapped around the abrasion to keep it from getting dirty.  °You may need a tetanus shot if: °· You cannot remember when you had your last tetanus shot. °· You have never had a tetanus shot. °· The injury broke your skin. °If you get a tetanus shot, your arm may swell, get red, and feel warm to the touch. This is common and not a problem. If you need a tetanus shot and you choose not to have one, there is a rare chance of getting tetanus. Sickness from tetanus can be serious.  °HOME CARE INSTRUCTIONS  °· If a dressing was applied, change it at least once a day or as directed by your caregiver. If the bandage sticks, soak it off with warm water.   °· Wash the area with water and a mild soap to remove all the ointment 2 times a day. Rinse off the soap and pat the area dry with a clean towel.   °· Reapply any ointment as directed by your caregiver. This will help prevent infection and keep the bandage from sticking. Use gauze over the wound and under the dressing to help keep the bandage from sticking.   °· Change your dressing right away if it becomes wet or dirty.   °· Only take over-the-counter or prescription medicines for pain, discomfort, or fever as  directed by your caregiver.   °· Follow up with your caregiver within 24 48 hours for a wound check, or as directed. If you were not given a wound-check appointment, look closely at your abrasion for redness, swelling, or pus. These are signs of infection. °SEEK IMMEDIATE MEDICAL CARE IF:  °· You have increasing pain in the wound.   °· You have redness, swelling, or tenderness around the wound.   °· You have pus coming from the wound.   °· You have a fever or persistent symptoms for more than 2 3 days. °· You have a fever and your symptoms suddenly get worse. °· You have a bad smell coming from the wound or dressing.   °MAKE SURE YOU:  °· Understand these instructions. °· Will watch your condition. °· Will get help right away if you are not doing well or get worse. °Document Released: 08/09/2005 Document Revised: 10/16/2012 Document Reviewed: 10/03/2011 °ExitCare® Patient Information ©2014 [employer]. ° °Contusion °A contusion is a deep bruise. Contusions are the result of an injury that caused bleeding under the skin. The contusion may turn blue, purple, or yellow. Minor injuries will give you a painless contusion, but more severe contusions may stay painful and swollen for a few weeks.  °CAUSES  °A contusion is usually caused by a blow, trauma, or direct force to an area of the body. °SYMPTOMS  °·   Swelling and redness of the injured area. °· Bruising of the injured area. °· Tenderness and soreness of the injured area. °· Pain. °DIAGNOSIS  °The diagnosis can be made by taking a history and physical exam. An X-ray, CT scan, or MRI may be needed to determine if there were any associated injuries, such as fractures. °TREATMENT  °Specific treatment will depend on what area of the body was injured. In general, the best treatment for a contusion is resting, icing, elevating, and applying cold compresses to the injured area. Over-the-counter medicines may also be recommended for pain control. Ask your caregiver what  the best treatment is for your contusion. °HOME CARE INSTRUCTIONS  °· Put ice on the injured area. °· Put ice in a plastic bag. °· Place a towel between your skin and the bag. °· Leave the ice on for 15-20 minutes, 03-04 times a day. °· Only take over-the-counter or prescription medicines for pain, discomfort, or fever as directed by your caregiver. Your caregiver may recommend avoiding anti-inflammatory medicines (aspirin, ibuprofen, and naproxen) for 48 hours because these medicines may increase bruising. °· Rest the injured area. °· If possible, elevate the injured area to reduce swelling. °SEEK IMMEDIATE MEDICAL CARE IF:  °· You have increased bruising or swelling. °· You have pain that is getting worse. °· Your swelling or pain is not relieved with medicines. °MAKE SURE YOU:  °· Understand these instructions. °· Will watch your condition. °· Will get help right away if you are not doing well or get worse. °Document Released: 08/09/2005 Document Revised: 01/22/2012 Document Reviewed: 09/04/2011 °ExitCare® Patient Information ©2014 [employer]. ° °

## 2014-01-23 ENCOUNTER — Inpatient Hospital Stay (HOSPITAL_COMMUNITY)
Admission: EM | Admit: 2014-01-23 | Discharge: 2014-01-27 | DRG: 640 | Disposition: A | Discharge status: Skilled Nursing Facility | Payer: Medicare Other | Attending: Family Medicine | Admitting: Family Medicine

## 2014-01-23 ENCOUNTER — Emergency Department (HOSPITAL_COMMUNITY): Payer: Medicare Other

## 2014-01-23 ENCOUNTER — Encounter (HOSPITAL_COMMUNITY): Payer: Self-pay | Admitting: Emergency Medicine

## 2014-01-23 DIAGNOSIS — I10 Essential (primary) hypertension: Secondary | ICD-10-CM | POA: Diagnosis present

## 2014-01-23 DIAGNOSIS — E870 Hyperosmolality and hypernatremia: Principal | ICD-10-CM | POA: Diagnosis present

## 2014-01-23 DIAGNOSIS — E46 Unspecified protein-calorie malnutrition: Secondary | ICD-10-CM

## 2014-01-23 DIAGNOSIS — L8992 Pressure ulcer of unspecified site, stage 2: Secondary | ICD-10-CM | POA: Diagnosis present

## 2014-01-23 DIAGNOSIS — M549 Dorsalgia, unspecified: Secondary | ICD-10-CM

## 2014-01-23 DIAGNOSIS — Z993 Dependence on wheelchair: Secondary | ICD-10-CM

## 2014-01-23 DIAGNOSIS — J449 Chronic obstructive pulmonary disease, unspecified: Secondary | ICD-10-CM | POA: Diagnosis present

## 2014-01-23 DIAGNOSIS — E861 Hypovolemia: Secondary | ICD-10-CM | POA: Diagnosis present

## 2014-01-23 DIAGNOSIS — E43 Unspecified severe protein-calorie malnutrition: Secondary | ICD-10-CM | POA: Insufficient documentation

## 2014-01-23 DIAGNOSIS — J4489 Other specified chronic obstructive pulmonary disease: Secondary | ICD-10-CM | POA: Diagnosis present

## 2014-01-23 DIAGNOSIS — M4000 Postural kyphosis, site unspecified: Secondary | ICD-10-CM | POA: Diagnosis present

## 2014-01-23 DIAGNOSIS — L89309 Pressure ulcer of unspecified buttock, unspecified stage: Secondary | ICD-10-CM | POA: Diagnosis present

## 2014-01-23 DIAGNOSIS — R627 Adult failure to thrive: Secondary | ICD-10-CM | POA: Diagnosis present

## 2014-01-23 DIAGNOSIS — E860 Dehydration: Secondary | ICD-10-CM | POA: Diagnosis present

## 2014-01-23 DIAGNOSIS — R000 Tachycardia, unspecified: Secondary | ICD-10-CM | POA: Diagnosis present

## 2014-01-23 DIAGNOSIS — G8929 Other chronic pain: Secondary | ICD-10-CM | POA: Diagnosis present

## 2014-01-23 DIAGNOSIS — Z66 Do not resuscitate: Secondary | ICD-10-CM | POA: Diagnosis present

## 2014-01-23 DIAGNOSIS — M545 Low back pain, unspecified: Secondary | ICD-10-CM | POA: Diagnosis present

## 2014-01-23 DIAGNOSIS — IMO0002 Reserved for concepts with insufficient information to code with codable children: Secondary | ICD-10-CM | POA: Diagnosis present

## 2014-01-23 DIAGNOSIS — L89109 Pressure ulcer of unspecified part of back, unspecified stage: Secondary | ICD-10-CM | POA: Diagnosis present

## 2014-01-23 DIAGNOSIS — M129 Arthropathy, unspecified: Secondary | ICD-10-CM | POA: Diagnosis present

## 2014-01-23 DIAGNOSIS — M4120 Other idiopathic scoliosis, site unspecified: Secondary | ICD-10-CM | POA: Diagnosis present

## 2014-01-23 DIAGNOSIS — L8990 Pressure ulcer of unspecified site, unspecified stage: Secondary | ICD-10-CM | POA: Diagnosis present

## 2014-01-23 HISTORY — DX: Chronic obstructive pulmonary disease, unspecified: J44.9

## 2014-01-23 LAB — CBC WITH DIFFERENTIAL/PLATELET
Basophils Absolute: 0 10*3/uL (ref 0.0–0.1)
Basophils Relative: 0 % (ref 0–1)
Eosinophils Absolute: 0 10*3/uL (ref 0.0–0.7)
Eosinophils Relative: 0 % (ref 0–5)
HCT: 45.6 % (ref 36.0–46.0)
Hemoglobin: 14.7 g/dL (ref 12.0–15.0)
LYMPHS PCT: 19 % (ref 12–46)
Lymphs Abs: 1.3 10*3/uL (ref 0.7–4.0)
MCH: 29.1 pg (ref 26.0–34.0)
MCHC: 32.2 g/dL (ref 30.0–36.0)
MCV: 90.1 fL (ref 78.0–100.0)
Monocytes Absolute: 0.4 10*3/uL (ref 0.1–1.0)
Monocytes Relative: 7 % (ref 3–12)
Neutro Abs: 5 10*3/uL (ref 1.7–7.7)
Neutrophils Relative %: 74 % (ref 43–77)
PLATELETS: 284 10*3/uL (ref 150–400)
RBC: 5.06 MIL/uL (ref 3.87–5.11)
RDW: 14.3 % (ref 11.5–15.5)
WBC: 6.7 10*3/uL (ref 4.0–10.5)

## 2014-01-23 LAB — URINALYSIS, ROUTINE W REFLEX MICROSCOPIC
GLUCOSE, UA: NEGATIVE mg/dL
HGB URINE DIPSTICK: NEGATIVE
Leukocytes, UA: NEGATIVE
Nitrite: NEGATIVE
PH: 5 (ref 5.0–8.0)
Protein, ur: NEGATIVE mg/dL
Specific Gravity, Urine: 1.025 (ref 1.005–1.030)
Urobilinogen, UA: 0.2 mg/dL (ref 0.0–1.0)

## 2014-01-23 LAB — LACTIC ACID, PLASMA: Lactic Acid, Venous: 1.7 mmol/L (ref 0.5–2.2)

## 2014-01-23 LAB — TROPONIN I: Troponin I: <0.3 ng/mL (ref <0.30)

## 2014-01-23 LAB — BASIC METABOLIC PANEL
BUN: 32 mg/dL — ABNORMAL HIGH (ref 6–23)
CO2: 34 meq/L — AB (ref 19–32)
Calcium: 9.8 mg/dL (ref 8.4–10.5)
Chloride: 99 mEq/L (ref 96–112)
Creatinine, Ser: 0.51 mg/dL (ref 0.50–1.10)
GFR calc Af Amer: >90 mL/min (ref >90)
GFR calc non Af Amer: 85 mL/min — ABNORMAL LOW (ref >90)
Glucose, Bld: 81 mg/dL (ref 70–99)
POTASSIUM: 3.8 meq/L (ref 3.7–5.3)
SODIUM: 149 meq/L — AB (ref 137–147)

## 2014-01-23 MED ORDER — METOPROLOL TARTRATE 25 MG PO TABS
25.0000 mg | ORAL_TABLET | Freq: Two times a day (BID) | ORAL | Status: DC
  Administered 2014-01-23 – 2014-01-27 (×7): 25 mg via ORAL
  Filled 2014-01-23 (×8): qty 1

## 2014-01-23 MED ORDER — ACETAMINOPHEN 325 MG PO TABS
650.0000 mg | ORAL_TABLET | Freq: Four times a day (QID) | ORAL | Status: DC | PRN
  Administered 2014-01-26: 650 mg via ORAL
  Filled 2014-01-23: qty 2

## 2014-01-23 MED ORDER — HYDROCODONE-ACETAMINOPHEN 5-325 MG PO TABS
1.0000 | ORAL_TABLET | ORAL | Status: DC | PRN
  Administered 2014-01-23: 1 via ORAL
  Filled 2014-01-23 (×2): qty 1

## 2014-01-23 MED ORDER — ONDANSETRON HCL 4 MG/2ML IJ SOLN
4.0000 mg | Freq: Four times a day (QID) | INTRAMUSCULAR | Status: DC | PRN
  Administered 2014-01-24: 4 mg via INTRAVENOUS
  Filled 2014-01-23: qty 2

## 2014-01-23 MED ORDER — METOPROLOL TARTRATE 1 MG/ML IV SOLN
2.5000 mg | INTRAVENOUS | Status: AC
  Administered 2014-01-23: 2.5 mg via INTRAVENOUS
  Filled 2014-01-23: qty 5

## 2014-01-23 MED ORDER — SODIUM CHLORIDE 0.9 % IV SOLN: INTRAVENOUS | Status: DC

## 2014-01-23 MED ORDER — TRAMADOL HCL 50 MG PO TABS
50.0000 mg | ORAL_TABLET | Freq: Four times a day (QID) | ORAL | Status: DC | PRN
  Administered 2014-01-23 – 2014-01-26 (×2): 50 mg via ORAL
  Filled 2014-01-23 (×2): qty 1

## 2014-01-23 MED ORDER — CLONIDINE HCL 0.1 MG PO TABS
0.1000 mg | ORAL_TABLET | Freq: Three times a day (TID) | ORAL | Status: DC
  Administered 2014-01-23 – 2014-01-27 (×12): 0.1 mg via ORAL
  Filled 2014-01-23 (×12): qty 1

## 2014-01-23 MED ORDER — INFLUENZA VAC SPLIT QUAD 0.5 ML IM SUSP
0.5000 mL | INTRAMUSCULAR | Status: AC
  Administered 2014-01-24: 0.5 mL via INTRAMUSCULAR
  Filled 2014-01-23: qty 0.5

## 2014-01-23 MED ORDER — SENNA 8.6 MG PO TABS
1.0000 | ORAL_TABLET | Freq: Two times a day (BID) | ORAL | Status: DC
  Administered 2014-01-23 – 2014-01-27 (×7): 8.6 mg via ORAL
  Filled 2014-01-23 (×7): qty 1

## 2014-01-23 MED ORDER — PNEUMOCOCCAL VAC POLYVALENT 25 MCG/0.5ML IJ INJ
0.5000 mL | INJECTION | INTRAMUSCULAR | Status: AC
  Administered 2014-01-24: 0.5 mL via INTRAMUSCULAR
  Filled 2014-01-23: qty 0.5

## 2014-01-23 MED ORDER — ACETAMINOPHEN 650 MG RE SUPP: 650.0000 mg | Freq: Four times a day (QID) | RECTAL | Status: DC | PRN

## 2014-01-23 MED ORDER — ALUM & MAG HYDROXIDE-SIMETH 200-200-20 MG/5ML PO SUSP: 30.0000 mL | Freq: Four times a day (QID) | ORAL | Status: DC | PRN

## 2014-01-23 MED ORDER — METOPROLOL TARTRATE 25 MG PO TABS: 12.5000 mg | ORAL_TABLET | Freq: Two times a day (BID) | ORAL | Status: DC

## 2014-01-23 MED ORDER — ENOXAPARIN SODIUM 30 MG/0.3ML ~~LOC~~ SOLN
30.0000 mg | SUBCUTANEOUS | Status: DC
  Administered 2014-01-23 – 2014-01-26 (×4): 30 mg via SUBCUTANEOUS
  Filled 2014-01-23 (×4): qty 0.3

## 2014-01-23 MED ORDER — ONDANSETRON HCL 4 MG PO TABS: 4.0000 mg | ORAL_TABLET | Freq: Four times a day (QID) | ORAL | Status: DC | PRN

## 2014-01-23 MED ORDER — SODIUM CHLORIDE 0.45 % IV SOLN
INTRAVENOUS | Status: DC
  Administered 2014-01-23 – 2014-01-27 (×2): via INTRAVENOUS

## 2014-01-23 MED ORDER — SODIUM CHLORIDE 0.9 % IV BOLUS (SEPSIS)
250.0000 mL | Freq: Once | INTRAVENOUS | Status: AC
  Administered 2014-01-23: 1000 mL via INTRAVENOUS

## 2014-01-23 NOTE — ED Notes (Signed)
Pt daughter in law and son at bedside states that pt has been declining for a week and half, not wanting to eat, generalized weakness for the past week,

## 2014-01-23 NOTE — ED Notes (Addendum)
Dr Memon at bedside,  
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
## 2014-01-23 NOTE — ED Notes (Signed)
Clydie BraunKaren NP at bedside with pt and family

## 2014-01-23 NOTE — Care Management Note (Signed)
Case management referral to see pt due to possible need for SNF or HH.Pt awake and alert. Son Fraser Dinreston and spouse in the room with pt. They state pt lives in an apartment with her daughter, but they do not feel she is doing a good job of caring for their mom and they have been trying to get the [facility] for the past 2 weeks, but [facility] not [facility] their [facility]. Pt is agreeing to go to SNF. They hope to have pt admitted from here if possible. Explained that if pt is admitted, she will only be admitted to SNF if she has a skilled need, which she may have, but this will have to be approved by her insurance. She also will have to choose a facility covered by her insurance.CSW will beable to find out if [facility] covered and if they have an available bed. LTC, on the other hand,  will have to be private pay or Medicaid, if she qualifies. They are aware of this and plan to apply at DSS. Explained that if pt is not able to go from the hospital to SNF, [facility] and [facility],and can [facility] to assist with the continued process of NH for the pt, and they are very open to this. Referred to CSW, as MD is admitting the pt for low potassium, and low Na+. Family and patient very appreciative of services of CM and referral to CSW for assistance.

## 2014-01-23 NOTE — H&P (Signed)
Patient seen and examined. Agree with note as above.  Patient admitted with failure to thrive, dehydration and tachycardia. She is severely malnourished. She has decubitus ulcers. She'll be started on IV fluids for rehydration. We'll request nutrition consult. We'll also request on consult for her ulcers. She does have a history of COPD. We'll start the patient on nebulizer treatments. Air entry is fair bilaterally with no wheezing. Do not feel she needs steroids at this time. We'll start her on a beta blocker for tachycardia/hypertension. She does not appear to be any distress right now. If tachycardia persists, then this can be further worked up, although it appears that this is related to her dehydration. She will need skilled nursing facility placement.  Autum Benfer

## 2014-01-23 NOTE — H&P (Signed)
[facility] History and Physical  Sara GroomsBarbara F [name] ZOX:[id number] DOB: 10/31/1926 DOA: 01/23/2014  Referring physician:  PCP: Alice ReichertMCINNIS,ANGUS G, MD   Chief Complaint: dehydration  HPI: Sara [name] is a 80 y.o. female with a past medical history of hypertension, scoliosis, chronic low back pain presents to the emergency department via EMS from home with the chief complaint of failure to thrive and dehydration. Information is obtained from the son and daughter-in-law. Patient resides with her daughter who is not present. Daughter-in-law reports that patient was brought in today because "enough is enough and we are all running around trying to take care of her". She elaborates indicating that over the last 7 days patient's appetite has decreased and her ability to pivot from bed to chair is declining. There is no report of cough nausea vomiting diarrhea. No report of fever chills or recent sick contacts. Patient indicates that she is not in any pain other than her usual back pain. She denies dysuria hematuria frequency or urgency. She denies any nausea diarrhea and states her last BM was yesterday. She indicates that her appetite is declining and she "is just not interested in food". She states she is interested in living and recognizes that she needs to keep to live. Initial evaluation in the emergency department reveals severely emaciated with skin breakdown at virtually all bony prominences. She is afebrile and not hypoxic but does have a respiratory rate of 28. She is tachycardic and hypertensive. Chest x-ray yields no edema or consolidation with some underlying emphysema. Basic metabolic panel reveals a sodium of 149 otherwise unremarkable. Lactic acid is 1.7 complete blood count is within the limits of normal.  Review of Systems:  10 point review of systems completed and all systems are negative except as indicated in the history of present illness Past Medical History  Diagnosis Date  .  Hypertension   . Scoliosis   . Arthritis    Past Surgical History  Procedure Laterality Date  . Knee surgery     Social History:  reports that she has never smoked. She does not have any smokeless tobacco history on file. She reports that she does not drink alcohol or use illicit drugs. She lives with her daughter she has been virtually wheelchair-bound for the last year she is completely dependent No Known Allergies  No family history on file. family medical history is reviewed and noncontributory to the admission of this elderly lady  Prior to Admission medications   Medication Sig Start Date End Date Taking? Authorizing Provider  cloNIDine (CATAPRES) 0.1 MG tablet Take 0.1 mg by mouth 3 (three) times daily.   Yes Historical Provider, MD  traMADol (ULTRAM) 50 MG tablet Take 50 mg by mouth every 6 (six) hours as needed. pain   Yes Historical Provider, MD   Physical Exam: Filed Vitals:   01/23/14 1400  BP: 156/107  Pulse:   Temp:   Resp: 22    BP 156/107  Pulse 61  Temp(Src) 97.9 F (36.6 C) (Rectal)  Resp 22  SpO2 93%  General:  Appears calm and comfortable and cachectic Eyes: PERRL, normal lids, irises & conjunctiva ENT: Ears are clear nose without drainage oropharynx without erythema or exudate. Mucous membranes of her mouth are pink but very dry Neck: no LAD, masses or thyromegaly Cardiovascular: Tachycardic but regular. Chest is nontender to palpation. Hear no murmur no gallop no rub. There is no lower tremor the edema Respiratory: Slightly tachypneic breath sounds are very distant but clear.  I hear no wheeze no rhonchi Abdomen: Flat soft sluggish bowel sounds nontender to palpation Skin: There is stage I to stage III skin breakdown on virtually every bony prominence on her backside. No other rash or lesions Musculoskeletal: Virtually no muscle tissue Psychiatric: grossly normal mood and affect, speech fluent and appropriate Neurologic: grossly non-focal.           Labs on Admission:  Basic Metabolic Panel:  Recent Labs Lab 01/23/14 1136  NA 149*  K 3.8  CL 99  CO2 34*  GLUCOSE 81  BUN 32*  CREATININE 0.51  CALCIUM 9.8   Liver Function Tests: No results found for this basename: AST, ALT, ALKPHOS, BILITOT, PROT, ALBUMIN,  in the last 168 hours No results found for this basename: LIPASE, AMYLASE,  in the last 168 hours No results found for this basename: AMMONIA,  in the last 168 hours CBC:  Recent Labs Lab 01/23/14 1136  WBC 6.7  NEUTROABS 5.0  HGB 14.7  HCT 45.6  MCV 90.1  PLT 284   Cardiac Enzymes:  Recent Labs Lab 01/23/14 1318  TROPONINI <0.30    BNP (last 3 results)  Recent Labs  08/13/13 1452  PROBNP 353.1   CBG: No results found for this basename: GLUCAP,  in the last 168 hours  Radiological Exams on Admission: Dg Chest Port 1 View  01/23/2014   CLINICAL DATA:  Dehydration  EXAM: PORTABLE CHEST - 1 VIEW  COMPARISON:  November 16, 2013  FINDINGS: There is underlying emphysematous change. There is no edema or consolidation. The heart is normal in size and contour. Pulmonary vascular reflects underlying emphysema. Aorta is tortuous and rather prominent but stable. Bones are diffusely osteoporotic.  IMPRESSION: No edema or consolidation. Underlying emphysema. Aortic prominence and tortuosity probably reflect chronic hypertensive change.   Electronically Signed   By: Bretta Bang M.D.   On: 01/23/2014 13:38    EKG: Independently reviewed. EKG with sinus tach right atrial enlargement with right bundle branch block. When compared to EKG January 2015 infarct now present  Assessment/Plan Principal Problem:   Dehydration: Related to decreased by mouth intake over the last several days. Will admit for observation. Will provide IV fluids. Will encourage by mouth intake. Active Problems:   Hypernatremia: Related to above. Will recheck in the a.m. once that her hydrated    Hypertension: Currently uncontrolled. Likely  related to not having her home clonidine. May be some rebound effect coming into play. I will resume her home clonidine and add metoprolol with parameters. Will monitor closely    Tachycardia: Likely related to dehydration and hypovolemia. She is afebrile with normal white count. No indication of infection. Urinalysis unremarkable. Chest x-ray without infiltrate. Provide IV fluids and monitor.    Failure to thrive: Etiology uncertain. Patient does suffer with chronic back pain indicates that her appetite has declined. Will request nutritional consult and offer 6 small meals versus 3 large meals.    Chronic back pain: Appears stable at baseline. Will provide pain medicine as needed    Decubital ulcer: We'll provide protective barrier. No indication of any of the sites being infected. Will reposition every 2 hours to minimize insult. Will check albumin and evaluate nutritional status. Request nutritional consult. Provide dietary supplements as recommended to optimize healing   Code Status: full Family Communication: son and daughter in law at bedside Disposition Plan: she needs to be placed  Time spent: 23 minutes  [facility] Pager [phone]

## 2014-01-23 NOTE — ED Notes (Signed)
EMS reports family called ems because pt has not been eating or drinking for past few days.  Pt denies pain.

## 2014-01-23 NOTE — ED Notes (Signed)
[facility] for further evaluation of weakness, not eating, pt lives with her daughter, has been bedridden and has had a decreased appetitie for the past few days, pt has pressure wounds noted to left shoulder, left buttock area,

## 2014-01-23 NOTE — ED Notes (Signed)
Paged Clydie BraunKaren NP for elevated hr, blood pressure,

## 2014-01-23 NOTE — ED Notes (Addendum)
Pt c/o soreness to buttock area, pain medicine given per prn order and request, pt repositioned,

## 2014-01-23 NOTE — ED Notes (Signed)
Notified Dr Kerry HoughMemon of vital signs, additional orders given

## 2014-01-23 NOTE — ED Notes (Signed)
Report given to the floor,   

## 2014-01-23 NOTE — ED Notes (Signed)
Dr. McManus at bedside,  

## 2014-01-23 NOTE — ED Notes (Signed)
[place] RN with case management at bedside speaking with pt and family

## 2014-01-24 DIAGNOSIS — E43 Unspecified severe protein-calorie malnutrition: Secondary | ICD-10-CM | POA: Insufficient documentation

## 2014-01-24 LAB — TSH: TSH: 1.295 u[IU]/mL (ref 0.350–4.500)

## 2014-01-24 LAB — BASIC METABOLIC PANEL
BUN: 35 mg/dL — ABNORMAL HIGH (ref 6–23)
CHLORIDE: 101 meq/L (ref 96–112)
CO2: 31 mEq/L (ref 19–32)
Calcium: 9 mg/dL (ref 8.4–10.5)
Creatinine, Ser: 0.55 mg/dL (ref 0.50–1.10)
GFR calc non Af Amer: 83 mL/min — ABNORMAL LOW (ref >90)
Glucose, Bld: 63 mg/dL — ABNORMAL LOW (ref 70–99)
POTASSIUM: 4.3 meq/L (ref 3.7–5.3)
SODIUM: 144 meq/L (ref 137–147)

## 2014-01-24 MED ORDER — BISACODYL 10 MG RE SUPP
10.0000 mg | Freq: Every day | RECTAL | Status: DC | PRN
  Filled 2014-01-24: qty 1

## 2014-01-24 MED ORDER — DOCUSATE SODIUM 100 MG PO CAPS
100.0000 mg | ORAL_CAPSULE | Freq: Two times a day (BID) | ORAL | Status: DC | PRN
  Administered 2014-01-24: 100 mg via ORAL
  Filled 2014-01-24: qty 1

## 2014-01-24 MED ORDER — DOCUSATE SODIUM 100 MG PO CAPS
100.0000 mg | ORAL_CAPSULE | Freq: Two times a day (BID) | ORAL | Status: DC
  Administered 2014-01-24 – 2014-01-27 (×5): 100 mg via ORAL
  Filled 2014-01-24 (×5): qty 1

## 2014-01-24 MED ORDER — ENSURE COMPLETE PO LIQD
237.0000 mL | Freq: Two times a day (BID) | ORAL | Status: DC
  Administered 2014-01-24 – 2014-01-27 (×6): 237 mL via ORAL

## 2014-01-24 MED ORDER — ENSURE PUDDING PO PUDG: 1.0000 | Freq: Three times a day (TID) | ORAL | Status: DC

## 2014-01-24 MED ORDER — PRO-STAT SUGAR FREE PO LIQD
30.0000 mL | Freq: Three times a day (TID) | ORAL | Status: DC
  Administered 2014-01-24 – 2014-01-27 (×8): 30 mL via ORAL
  Filled 2014-01-24 (×6): qty 30

## 2014-01-24 MED ORDER — MEGESTROL ACETATE 400 MG/10ML PO SUSP
400.0000 mg | Freq: Every day | ORAL | Status: DC
  Administered 2014-01-25 – 2014-01-27 (×3): 400 mg via ORAL
  Filled 2014-01-24 (×7): qty 10

## 2014-01-24 NOTE — Progress Notes (Signed)
Plan of care discussed with Dr. Renard MatterMcinnis.  Voiced to him according to the night nurse the patient is constipated AEB with rounding the patient was found to have hard stool protruding out of her rectum and the patient had to be disimpacted. I  Suggested/inquired  to the MD due the patient nutritional status that maybe she could benefit from taking Megace to stimulate her appetite and also she would need medication to assist with her bowels.  New orders given and followed.

## 2014-01-24 NOTE — Progress Notes (Signed)
Subjective: The patient had a fairly restful night. She was admitted with severe malnourishment and decubitus ulcers has a history COPD and started nebulizer treatments. She is thought to be dehydrated and receiving intravenous fluids   Objective: Vital signs in last 24 hours: Temp:  [97.5 F (36.4 C)-98.5 F (36.9 C)] 97.8 F (36.6 C) (03/14 0600) Pulse Rate:  [50-132] 50 (03/14 0600) Resp:  [16-30] 16 (03/14 0600) BP: (100-160)/(60-128) 100/60 mmHg (03/14 0613) SpO2:  [92 %-99 %] 92 % (03/14 0600) Weight:  [26.535 kg (58 lb 8 oz)] 26.535 kg (58 lb 8 oz) (03/13 1831) Weight change:     Intake/Output from previous day: 03/13 0701 - 03/14 0700 In: -  Out: 13 [Urine:13] Intake/Output this shift:    Physical Exam: There appears patient appears comfortable  Eyes PERRLA TM negative oropharynx benign neck supple no JVD or thyroid abnormalities  Heart regular rhythm no murmurs  Lungs clear to P&A  Abdomen the palpable organs or masses no organomegaly skin patient has skin breakdown on spine  Musculoskeletal atrophy of the muscle tissue   Recent Labs  01/23/14 1136  WBC 6.7  HGB 14.7  HCT 45.6  PLT 284   BMET  Recent Labs  01/23/14 1136  NA 149*  K 3.8  CL 99  CO2 34*  GLUCOSE 81  BUN 32*  CREATININE 0.51  CALCIUM 9.8    Studies/Results: Dg Chest Port 1 View  01/23/2014   CLINICAL DATA:  Dehydration  EXAM: PORTABLE CHEST - 1 VIEW  COMPARISON:  November 16, 2013  FINDINGS: There is underlying emphysematous change. There is no edema or consolidation. The heart is normal in size and contour. Pulmonary vascular reflects underlying emphysema. Aorta is tortuous and rather prominent but stable. Bones are diffusely osteoporotic.  IMPRESSION: No edema or consolidation. Underlying emphysema. Aortic prominence and tortuosity probably reflect chronic hypertensive change.   Electronically Signed   By: Bretta BangWilliam  Woodruff M.D.   On: 01/23/2014 13:38    Medications:  .  cloNIDine  0.1 mg Oral 3 times per day  . enoxaparin (LOVENOX) injection  30 mg Subcutaneous Q24H  . influenza vac split quadrivalent PF  0.5 mL Intramuscular Tomorrow-1000  . metoprolol tartrate  25 mg Oral BID  . pneumococcal 23 valent vaccine  0.5 mL Intramuscular Tomorrow-1000  . senna  1 tablet Oral BID    . sodium chloride 100 mL/hr at 01/23/14 2120     Assessment/Plan: 1. Failure to thrive-plan nutritional consult  2. Chronic back pain secondary to old compression fractures  3 hypertension essential currently controlled  4 dehydration and hypernatremia plan to hydrate recheck chemistries   LOS: 1 day   Aster Screws G 01/24/2014, 6:59 AM

## 2014-01-24 NOTE — Progress Notes (Signed)
UR completed 
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
## 2014-01-25 LAB — BASIC METABOLIC PANEL
BUN: 27 mg/dL — ABNORMAL HIGH (ref 6–23)
CHLORIDE: 98 meq/L (ref 96–112)
CO2: 31 mEq/L (ref 19–32)
Calcium: 8.1 mg/dL — ABNORMAL LOW (ref 8.4–10.5)
Creatinine, Ser: 0.42 mg/dL — ABNORMAL LOW (ref 0.50–1.10)
GFR calc Af Amer: >90 mL/min (ref >90)
Glucose, Bld: 79 mg/dL (ref 70–99)
Potassium: 3.9 mEq/L (ref 3.7–5.3)
SODIUM: 136 meq/L — AB (ref 137–147)

## 2014-01-25 LAB — URINE CULTURE
Colony Count: NO GROWTH
Culture: NO GROWTH

## 2014-01-25 NOTE — Progress Notes (Signed)
Late Entry for 0800   Notified Dr. Renard MatterMcinnis of the patients family wishes for the patient to be under the DNR status.  MD stated he would talk to the patient and family.

## 2014-01-25 NOTE — Progress Notes (Signed)
Subjectiv the patient is alert and oriented she had a fairly restful night she's been seen by a dietitian and is noted she has severe malnutrition   Objective: Vital signs in last 24 hours: Temp:  [97.2 F (36.2 C)-97.9 F (36.6 C)] 97.3 F (36.3 C) (03/15 0500) Pulse Rate:  [57-93] 57 (03/15 0500) Resp:  [16-18] 16 (03/15 0500) BP: (120-156)/(70-82) 140/80 mmHg (03/15 0500) SpO2:  [80 %-100 %] 100 % (03/15 0500) Weight change:     Intake/Output from previous day: 03/14 0701 - 03/15 0700 In: 1440 [P.O.:240; I.V.:1200] Out: -  Intake/Output this shift: Total I/O In: 1200 [I.V.:1200] Out: -   Physical Exam: The patient appears comfortable  Eyes PERRLA TM negative oropharynx benign  Heart regular rhythm no murmurs  Lungs clear to P&A  Abdomen the palpable organs or masses  Muscle skeletal atrophy of muscle tissue and extremities and cachectic    Recent Labs  01/23/14 1136  WBC 6.7  HGB 14.7  HCT 45.6  PLT 284   BMET  Recent Labs  01/23/14 1136 01/24/14 0656  NA 149* 144  K 3.8 4.3  CL 99 101  CO2 34* 31  GLUCOSE 81 63*  BUN 32* 35*  CREATININE 0.51 0.55  CALCIUM 9.8 9.0    Studies/Results: Dg Chest Port 1 View  01/23/2014   CLINICAL DATA:  Dehydration  EXAM: PORTABLE CHEST - 1 VIEW  COMPARISON:  November 16, 2013  FINDINGS: There is underlying emphysematous change. There is no edema or consolidation. The heart is normal in size and contour. Pulmonary vascular reflects underlying emphysema. Aorta is tortuous and rather prominent but stable. Bones are diffusely osteoporotic.  IMPRESSION: No edema or consolidation. Underlying emphysema. Aortic prominence and tortuosity probably reflect chronic hypertensive change.   Electronically Signed   By: Bretta BangWilliam  Woodruff M.D.   On: 01/23/2014 13:38    Medications:  . cloNIDine  0.1 mg Oral 3 times per day  . docusate sodium  100 mg Oral BID  . enoxaparin (LOVENOX) injection  30 mg Subcutaneous Q24H  . feeding  supplement (ENSURE COMPLETE)  237 mL Oral BID BM  . feeding supplement (PRO-STAT SUGAR FREE 64)  30 mL Oral TID WC  . megestrol  400 mg Oral Daily  . metoprolol tartrate  25 mg Oral BID  . senna  1 tablet Oral BID    . sodium chloride 100 mL/hr at 01/23/14 2120     Assessment/Plan: 1 failure to thrive plan nutritional consult  2. Chronic back pain secondary to old compression fracture fractures  Hypertension currently controlled  4 dehydration somewhat improved   LOS: 2 days   Katrina Brosh G 01/25/2014, 6:47 AM

## 2014-01-26 MED ORDER — BACITRACIN-NEOMYCIN-POLYMYXIN OINTMENT TUBE
TOPICAL_OINTMENT | Freq: Every day | CUTANEOUS | Status: DC
  Administered 2014-01-27 (×2): via TOPICAL
  Filled 2014-01-26: qty 15

## 2014-01-26 NOTE — Progress Notes (Signed)
Subjective: The patient is alert and oriented with no new issues. Discussions were held with the family and they do want her to be a DO NOT RESUSCITATE Dietitian is working with her with reference to her severe malnutrition.  Objective: Vital signs in last 24 hours: Temp:  [97.1 F (36.2 C)] 97.1 F (36.2 C) (03/15 1641) Pulse Rate:  [59-92] 59 (03/16 0429) Resp:  [16] 16 (03/16 0429) BP: (130-142)/(76-92) 130/92 mmHg (03/16 0429) SpO2:  [95 %-97 %] 97 % (03/16 0429) Weight change:     Intake/Output from previous day: 03/15 0701 - 03/16 0700 In: 90 [P.O.:90] Out: 1 [Stool:1] Intake/Output this shift:    Physical Exam: The patient appears comfortable  Eyes PERRLA TM negative oropharynx benign  Heart regular rhythm no murmurs  Lungs clear to P&A  Abdomen no palpable organs or masses  Months musculoskeletal atrophy and cachexia   Recent Labs  01/23/14 1136  WBC 6.7  HGB 14.7  HCT 45.6  PLT 284   BMET  Recent Labs  01/24/14 0656 01/25/14 0621  NA 144 136*  K 4.3 3.9  CL 101 98  CO2 31 31  GLUCOSE 63* 79  BUN 35* 27*  CREATININE 0.55 0.42*  CALCIUM 9.0 8.1*    Studies/Results: No results found.  Medications:  . cloNIDine  0.1 mg Oral 3 times per day  . docusate sodium  100 mg Oral BID  . enoxaparin (LOVENOX) injection  30 mg Subcutaneous Q24H  . feeding supplement (ENSURE COMPLETE)  237 mL Oral BID BM  . feeding supplement (PRO-STAT SUGAR FREE 64)  30 mL Oral TID WC  . megestrol  400 mg Oral Daily  . metoprolol tartrate  25 mg Oral BID  . senna  1 tablet Oral BID    . sodium chloride 100 mL/hr at 01/23/14 2120     Assessment/Plan: 1. Failure to thrive plan to attempt to get patient on a more adequate diet  2. Chronic back pain secondary to old compression fractures  3 hypertension currently controlled  4 dehydration somewhat improved plan to get patient into a nursing facility for continued care   LOS: 3 days   Kaylynne Andres  G 01/26/2014, 6:11 AM

## 2014-01-26 NOTE — Consult Note (Signed)
WOC wound consult note Reason for Consult: multiple pressure ulcers. Pt admitted from home, very emaciated. FTT and dehydration. Kyphosis-severe.  Daughter at bedside, they state she has not walked in several weeks.   Wound type: Daughters report wounds on her toes, she has one small open area on the 3rd toe on the right(dorsal surface)and the 2nd toe on the left (plantar surface).  The family has used betadine and alcohol previously.  I have asked them to discontinue that for now. (2) Stage II pressure ulcer left buttock, coccyx and left scapula. At risk areas bilateral elbows and right buttock, they are currently blanchable.  Pressure Ulcer POA: Yes x4 Measurement: Coccyx: 2.0cm x 2.0cm x 0 Left buttock 2 open areas partial thickness 1.0cm x 2.0cm x 0.2cm and 1.0cm x 1.0cm x 0.2cm; left scapula: 2.5cm x 2.0cm x 0.2cm (appears to be some loosening skin) partial thickness skin loss Wound bed: all open areas are pink, moist, and clean.   Drainage (amount, consistency, odor) mostly coming from the left scapula, however this is the only area with a dressing currently.  She did have some sanguinous drainage on the under pad consistent pattern matching site of the left buttock wounds.  Periwound: intact Dressing procedure/placement/frequency:  Silicone foam over the open wounds and over the at risk areas to protect them from further injury.  Add air mattress overlay since this patient has high risk for further breakdown and also she has pressure ulcers on several of her turning surfaces.   TAB for the toe wounds, they are mostly healed.  Discussed POC with patient and bedside nurse.  Re consult if needed, will not follow at this time. Thanks  Gavriel Holzhauer RN, CFoot LockerWOCN [phone])

## 2014-01-26 NOTE — Evaluation (Signed)
Physical Therapy Evaluation Patient Details Name: Sara [name] MRN: [id number] DOB: Jul 04, 1926 Today's Date: 01/26/2014 Time: 4098-1191 PT Time Calculation (min): 31 min  PT Assessment / Plan / Recommendation History of Present Illness  Pt has been admitted with dehydration and failure to thrive.  She has been living with her daughter and other family members have indicated that she has not been well cared for.  Clinical Impression   Pt was seen for evaluation.  She appears to be severely malnourished (58# in chart) with a severe kyphosis.  She has significant muscle atrophy with some flexion contracture of both hips and knees (about 20 degrees).  Her sitting balance is only fair and she needed max assist to transfer supine to sit to stand.  She was very pleasant and cooperative with poor memory.  She was able to try to follow directions.  Because she does demonstrate the ability to work with [place], I am recommending SNF at d/c.  We will work with general strengthening and transfers while in hospital.    PT Assessment  Patient needs continued PT services    Follow Up Recommendations  SNF       Barriers to Discharge Decreased caregiver support family is unable to care for pt at home    Equipment Recommendations  None recommended by PT         Frequency Min 3X/week    Precautions / Restrictions Precautions Precautions: Fall Precaution Comments: skin precautions...very vulnerable to pressure sores due to emaciation Restrictions Weight Bearing Restrictions: No         Mobility  Bed Mobility Overal bed mobility: Needs Assistance Bed Mobility: Supine to Sit Supine to sit: Max assist;HOB elevated Transfers Overall transfer level: Needs assistance Equipment used: None Transfers: Stand Pivot Transfers Stand pivot transfers: Max assist General transfer comment: pt is able to bear some weight on LEs during transfer    Exercises     PT Diagnosis: Generalized weakness  PT  Problem List: Decreased strength;Decreased activity tolerance;Decreased balance;Decreased mobility PT Treatment Interventions: Functional mobility training;Therapeutic exercise     PT Goals(Current goals can be found in the care plan section) Acute Rehab PT Goals Patient Stated Goal: none stated PT Goal Formulation: Patient unable to participate in goal setting Time For Goal Achievement: 02/09/14 Potential to Achieve Goals: Fair  Visit Information  Last PT Received On: 01/26/14 History of Present Illness: Pt has been admitted with dehydration and failure to thrive.  She has been living with her daughter and other family members have indicated that she has not been well cared for.       Prior Functioning  Home Living Family/patient expects to be discharged to:: Skilled nursing facility Prior Function Level of Independence: Needs assistance Gait / Transfers Assistance Needed: has not been ambulatory and in a decline iin last 2 months ADL's / Homemaking Assistance Needed: it appears that pt has needed full assist in dressing and bathing, needs some assist with feeding Communication Communication: No difficulties    Cognition  Cognition Arousal/Alertness: Awake/alert Behavior During Therapy: WFL for tasks assessed/performed Overall Cognitive Status: Within Functional Limits for tasks assessed Memory: Decreased short-term memory    Extremity/Trunk Assessment Lower Extremity Assessment Lower Extremity Assessment: Generalized weakness Cervical / Trunk Assessment Cervical / Trunk Assessment: Kyphotic (severe kyphosis)   Balance Balance Overall balance assessment: Needs assistance Sitting-balance support: Bilateral upper extremity supported;Feet supported Sitting balance-Leahy Scale: Fair Sitting balance - Comments: it took about 1 minute before pt was able to find center  of balance to maintain independent sitting...prior to this, she neede max assist to prevent falling  backward Standing balance support: Bilateral upper extremity supported Standing balance-Leahy Scale: Zero  End of Session PT - End of Session Equipment Utilized During Treatment: Gait belt Activity Tolerance: Patient tolerated treatment well Patient left: in chair;with call bell/phone within reach;with chair alarm set Nurse Communication: Mobility status  GP     Konrad PentaBrown, Tracy Kinner L 01/26/2014, 9:25 AM

## 2014-01-26 NOTE — Clinical Social Work Psychosocial (Signed)
Clinical Social Work Department BRIEF PSYCHOSOCIAL ASSESSMENT 01/26/2014  Patient:  Sara [name], Sara [name]     Account Number:  [id number]     Admit date:  01/23/2014  Clinical Social Worker:  Wyatt Haste Date/Time:  01/26/2014 12:21 PM  Referred by:  Physician  Date Referred:  01/26/2014 Referred for  SNF Placement   Other Referral:   Interview type:  Family Other interview type:   2 children    PSYCHOSOCIAL DATA Living Status:  FAMILY Admitted from facility:   Level of care:   Primary support name:  Deborah/Preston Primary support relationship to patient:  CHILD, ADULT Degree of support available:   supportive    CURRENT CONCERNS Current Concerns  Post-Acute Placement   Other Concerns:    SOCIAL WORK ASSESSMENT / PLAN CSW met with pt's children in waiting room as pt has other visitors. She is oriented to self only currently. Pt has 5 children and no HCPOA per family. She is divorced. Her oldest daughter lives in a ALF. All other children live locally and are involved. CSW met with daughter Neoma Laming and son Jaci Standard as well as two daughters-in-law. Her daughter, Tessie Fass has been living with her for the past 3-4 years. At first, this arrangement was just for supervision for pt. However, more recently she has become full time caregiver. Family report more falls recently and the last couple weeks pt has been bedridden. She was able to ambulate using a walker, but primarily transferred to wheelchair. Children report weight loss recently. They indicate that Patsy was not encouraging/helping pt with eating. They also feel that pt's hygiene was not adequate. Children have been trying to get pt into a facility for about 2 years, but especially in the past few weeks. They are all in agreement per Neoma Laming and [place] that pt needs placement. PT worked with pt and recommendation is for SNF. Family state that they will need to have long term placement. They state that they are feeling very relieved  that pt can go to SNF and will not return home. Aware that other options may need to be considered after pt completes rehab. Discussed Medicaid with family if needed in the future. SNF list provided. Aware of copays. Agreeable to [facility] or [place] facilities. CSW spoke with [facility] and explained situation. She reports no report necessary as long as pt goes to SNF. Family also does not feel report is needed, they just want to see pt in facility. Note in referral about advance directives. However, pt does not appear to be fully oriented in order to complete.   Assessment/plan status:  Psychosocial Support/Ongoing Assessment of Needs Other assessment/ plan:   Information/referral to community resources:   SNF list    PATIENT'S/FAMILY'S RESPONSE TO PLAN OF CARE: Pt unable to complete assessment at this time. Family express concern regarding pt's living situation and are relieved that recommendation is for SNF. CSW will initiate bed search and follow up with offers when available.       Benay Pike, [place]

## 2014-01-26 NOTE — Clinical Social Work Placement (Signed)
Clinical Social Work Department CLINICAL SOCIAL WORK PLACEMENT NOTE 01/26/2014  Patient:  Sara [name],Sara [name]  Account Number:  [id number] Admit date:  01/23/2014  Clinical Social Worker:  Derenda FennelKARA Keziah Avis, LCSW  Date/time:  01/26/2014 12:17 PM  Clinical Social Work is seeking post-discharge placement for this patient at the following level of care:   SKILLED NURSING   (*CSW will update this form in Epic as items are completed)   01/26/2014  [facility]family provided with [facility] Work's list of facilities offering this level of care within the geographic area requested by the patient (or if unable, by the patient's family).  01/26/2014  Patient/family informed of their freedom to choose among providers that offer the needed level of care, that participate in Medicare, Medicaid or managed care program needed by the patient, have an available bed and are willing to accept the patient.  01/26/2014  Patient/family informed of [facility]' ownership interest in [facility], as well as of the fact that they are under no obligation to receive care at this facility.  PASARR submitted to EDS on 01/26/2014 PASARR number received from EDS on 01/26/2014  FL2 transmitted to all facilities in geographic area requested by pt/family on  01/26/2014 FL2 transmitted to all facilities within larger geographic area on   Patient informed that his/her managed care company has contracts with or will negotiate with  certain facilities, including the following:     Patient/family informed of bed offers received:   Patient chooses bed at  Physician recommends and patient chooses bed at    Patient to be transferred to  on   Patient to be transferred to facility by   The following physician request were entered in Epic:   Additional Comments:  Derenda FennelKara Angad Nabers, LCSW [phone]

## 2014-01-26 NOTE — Care Management Note (Addendum)
    Page 1 of 1   01/27/2014     1:42:25 PM   CARE MANAGEMENT NOTE 01/27/2014  Patient:  Sara [name],Sara [name]   Account Number:  [id number]  Date Initiated:  01/26/2014  Documentation initiated by:  Sharrie RothmanBLACKWELL,Bowie Delia C  Subjective/Objective Assessment:   Pt admitted from home with dehydration. Pt lives with her daughter but will not be able to return home at discharge. Pt is totally dependent with ADL's at this time.     Action/Plan:   CSW is aware that pt needs SNF and will initiate bed search.   Anticipated DC Date:  01/28/2014   Anticipated DC Plan:  SKILLED NURSING FACILITY  In-house referral  Clinical Social Worker      DC Planning Services  CM consult      Choice offered to / List presented to:             Status of service:  Completed, signed off Medicare Important Message given?  YES (If response is "NO", the following Medicare IM given date fields will be blank) Date Medicare IM given:  01/27/2014 Date Additional Medicare IM given:    Discharge Disposition:  HOME/SELF CARE  Per UR Regulation:    If discussed at Long Length of Stay Meetings, dates discussed:    Comments:  01/27/14 1340 Arlyss Queenammy Paraskevi Funez, RN BSN CM Pt discharged to [facility] today. CSW to arrange discharge to facility.  01/26/14 1430 Arlyss Queenammy Elya Tarquinio, RN BSN CM

## 2014-01-26 NOTE — Progress Notes (Signed)
Foam dressings placed on the areas as prescribed.

## 2014-01-27 MED ORDER — METOPROLOL TARTRATE 25 MG PO TABS: 25.0000 mg | ORAL_TABLET | Freq: Two times a day (BID) | ORAL | Status: AC

## 2014-01-27 MED ORDER — MEGESTROL ACETATE 400 MG/10ML PO SUSP: 400.0000 mg | Freq: Every day | ORAL | Status: AC

## 2014-01-27 NOTE — Progress Notes (Signed)
Subjective: The patient apparently has no new issues. She has severe malnutrition states to all from left buttock. She is a DO NOT RESUSCITATE. Has had dehydration and condition remains much the same.  Objective: Vital signs in last 24 hours: Temp:  [96.2 F (35.7 C)-98.6 F (37 C)] 98.6 F (37 C) (03/17 0530) Pulse Rate:  [71-96] 71 (03/17 0530) Resp:  [16-18] 16 (03/17 0530) BP: (133-155)/(68-100) 135/84 mmHg (03/17 0530) SpO2:  [93 %-100 %] 100 % (03/17 0530) Weight change:  Last BM Date: 01/26/14  Intake/Output from previous day: 03/16 0701 - 03/17 0700 In: 300 [P.O.:300] Out: -  Intake/Output this shift:    Physical Exam: The patient appears fairly comfortable does respond to spoken voice.  Eyes PERRLA TM negative oropharynx benign  Heart regular rhythm no murmurs  Lungs clear to P&A  Abdomen no palpable organs or masses  Musculoskeletal atrophy and cachexia  Stage II ulcer left buttock  No results found for this basename: WBC, HGB, HCT, PLT,  in the last 72 hours BMET  Recent Labs  01/24/14 0656 01/25/14 0621  NA 144 136*  K 4.3 3.9  CL 101 98  CO2 31 31  GLUCOSE 63* 79  BUN 35* 27*  CREATININE 0.55 0.42*  CALCIUM 9.0 8.1*    Studies/Results: No results found.  Medications:  . cloNIDine  0.1 mg Oral 3 times per day  . docusate sodium  100 mg Oral BID  . enoxaparin (LOVENOX) injection  30 mg Subcutaneous Q24H  . feeding supplement (ENSURE COMPLETE)  237 mL Oral BID BM  . feeding supplement (PRO-STAT SUGAR FREE 64)  30 mL Oral TID WC  . megestrol  400 mg Oral Daily  . metoprolol tartrate  25 mg Oral BID  . neomycin-bacitracin-polymyxin   Topical Daily  . senna  1 tablet Oral BID    . sodium chloride 100 mL/hr at 01/27/14 0521     Assessment/Plan: 1. Failure to thrive, dehydration-plan to continue food supplements have added Megace  2 chronic back pain severe kyphosis and old compression fractures  3 hypertension currently  controlled  4. Dehydration somewhat improved plan to continue seeking placement and nursing facility continue current regimen.   LOS: 4 days   Ronan Duecker G 01/27/2014, 6:20 AM

## 2014-01-27 NOTE — Progress Notes (Signed)
Called report to [facility]. Awaiting EMS arrival to transport patient to facility. Family at bedside. IV cath removed and intact. No pain/swelling at site.

## 2014-01-27 NOTE — Clinical Social Work Placement (Signed)
Clinical Social Work Department CLINICAL SOCIAL WORK PLACEMENT NOTE 01/27/2014  Patient:  Sara [name],Sara [name]  Account Number:  [id number] Admit date:  01/23/2014  Clinical Social Worker:  Derenda FennelKARA Paisli Silfies, LCSW  Date/time:  01/26/2014 12:17 PM  Clinical Social Work is seeking post-discharge placement for this patient at the following level of care:   SKILLED NURSING   (*CSW will update this form in Epic as items are completed)   01/26/2014  Patient/family provided with [facility] Department of Clinical Social Work's list of facilities offering this level of care within the geographic area requested by the patient (or if unable, by the patient's family).  01/26/2014  Patient/family informed of their freedom to choose among providers that offer the needed level of care, that participate in Medicare, Medicaid or managed care program needed by the patient, have an available bed and are willing to accept the patient.  01/26/2014  Patient/family informed of [facility]' ownership interest in [facility], as well as of the fact that they are under no obligation to receive care at this facility.  PASARR submitted to EDS on 01/26/2014 PASARR number received from EDS on 01/26/2014  FL2 transmitted to all facilities in geographic area requested by pt/family on  01/26/2014 FL2 transmitted to all facilities within larger geographic area on   Patient informed that his/her managed care company has contracts with or will negotiate with  certain facilities, including the following:     Patient/family informed of bed offers received:  01/27/2014 [facility] [place] [facility]ician [facility] and [facility] [place]  [facility] [place] [facility]  01/27/2014 [facility]  The following physician request were entered in Epic:   Additional Comments:  Derenda FennelKara Crystelle Ferrufino, LCSW [phone]

## 2014-01-27 NOTE — Clinical Social Work Note (Signed)
CSW presented bed offers and pt's daughter Gavin PoundDeborah spoke with her siblings and they accept [facility]. Facility notified. Pt d/c today to SNF. Facility willing to contact MD regarding script for Ultram. D/C summary faxed. [facility].  Derenda FennelKara Ruel Dimmick, KentuckyLCSW [phone]

## 2014-01-27 NOTE — Discharge Summary (Signed)
Physician Discharge Summary  Sara [name] ZOX:[id number] DOB: 31-May-1926 DOA: 01/23/2014  PCP: Alice Reichert, MD  Admit date: 01/23/2014 Discharge date: 01/27/2014     Discharge Diagnoses:  1. Dehydration with hypernatremia 2. Hypertension essential 3. Failure to thrive severe malnutrition 4. Chronic back pain with prior vertebral fractures 5. Decubitus ulcer gluteal area  Discharge Condition: Fair Disposition: Patient discharged to skilled nursing facility  Diet recommendation: Regular diet with boost supplements   Filed Weights   01/23/14 1831  Weight: 26.535 kg (58 lb 8 oz)    History of present illness:  The patient was admitted to the hospital with failure to thrive dehydration and tachycardia. She has been severely malnourished with decubitus ulcers. Started on intravenous fluids in the emergency department on admission  Hospital Course:  The patient was started on intravenous fluids at the time of her admission. During her hospital stay she was seen by dietitian and will start on Ensure food supplements. She was noted to extremely cachectic. She was also started on Megace 400 mg daily. She is continued on her regular medications the patient has weighed between 80 and 100 pounds for most of her adult life and has had 28% weight loss in past year. X-ray of her chest was essentially negative urine.   chemistries remained fairly normal. The family expressed the desire to have her placed in skilled nursing facility for further treatment. Search began or skilled bed. This was accomplished on the fourth hospital day.   Discharge Instructions the patient is to continue the following medications, she'll be continued on Megace 400 mg daily and Ensure supplements. The patient was stable at the time of her transfer.     Medication List         cloNIDine 0.1 MG tablet  Commonly known as:  CATAPRES  Take 0.1 mg by mouth 3 (three) times daily.     megestrol 400 MG/10ML  suspension  Commonly known as:  MEGACE  Take 10 mLs (400 mg total) by mouth daily.     metoprolol tartrate 25 MG tablet  Commonly known as:  LOPRESSOR  Take 1 tablet (25 mg total) by mouth 2 (two) times daily.     traMADol 50 MG tablet  Commonly known as:  ULTRAM  Take 50 mg by mouth every 6 (six) hours as needed. pain       No Known Allergies  The results of significant diagnostics from this hospitalization (including imaging, microbiology, ancillary and laboratory) are listed below for reference.    Significant Diagnostic Studies: Dg Chest Port 1 View  01/23/2014   CLINICAL DATA:  Dehydration  EXAM: PORTABLE CHEST - 1 VIEW  COMPARISON:  November 16, 2013  FINDINGS: There is underlying emphysematous change. There is no edema or consolidation. The heart is normal in size and contour. Pulmonary vascular reflects underlying emphysema. Aorta is tortuous and rather prominent but stable. Bones are diffusely osteoporotic.  IMPRESSION: No edema or consolidation. Underlying emphysema. Aortic prominence and tortuosity probably reflect chronic hypertensive change.   Electronically Signed   By: Bretta Bang M.D.   On: 01/23/2014 13:38    Microbiology: Recent Results (from the past 240 hour(s))  URINE CULTURE     Status: None   Collection Time    01/23/14  1:18 PM      Result Value Ref Range Status   Specimen Description URINE, CATHETERIZED   Final   Special Requests NONE   Final   Culture  Setup Time  Final   Value: 01/24/2014 19:12     Performed at [facility]   Colony Count     Final   Value: NO GROWTH     Performed at [facility]   Culture     Final   Value: NO GROWTH     Performed at [facility]   Report Status 01/25/2014 FINAL   Final     Labs: Basic Metabolic Panel:  Recent Labs Lab 01/23/14 1136 01/24/14 0656 01/25/14 0621  NA 149* 144 136*  K 3.8 4.3 3.9  CL 99 101 98  CO2 34* 31 31  GLUCOSE 81 63* 79  BUN 32* 35* 27*  CREATININE  0.51 0.55 0.42*  CALCIUM 9.8 9.0 8.1*   Liver Function Tests: No results found for this basename: AST, ALT, ALKPHOS, BILITOT, PROT, ALBUMIN,  in the last 168 hours No results found for this basename: LIPASE, AMYLASE,  in the last 168 hours No results found for this basename: AMMONIA,  in the last 168 hours CBC:  Recent Labs Lab 01/23/14 1136  WBC 6.7  NEUTROABS 5.0  HGB 14.7  HCT 45.6  MCV 90.1  PLT 284   Cardiac Enzymes:  Recent Labs Lab 01/23/14 1318  TROPONINI <0.30   BNP: BNP (last 3 results)  Recent Labs  08/13/13 1452  PROBNP 353.1   CBG: No results found for this basename: GLUCAP,  in the last 168 hours  Principal Problem:   Dehydration Active Problems:   Hypernatremia   Hypertension   Tachycardia   Failure to thrive   Chronic back pain   Decubital ulcer   Protein-calorie malnutrition, severe   Time coordinating discharge:30 minutes   Signed:  Butch PennyAngus Kenneith Stief, MD 01/27/2014, 12:31 PM

## 2014-01-28 NOTE — Progress Notes (Signed)
UR chart review completed.  

## 2014-02-25 ENCOUNTER — Emergency Department (HOSPITAL_COMMUNITY): Payer: Medicare Other

## 2014-02-25 ENCOUNTER — Encounter (HOSPITAL_COMMUNITY): Payer: Self-pay | Admitting: Emergency Medicine

## 2014-02-25 ENCOUNTER — Inpatient Hospital Stay (HOSPITAL_COMMUNITY)
Admission: EM | Admit: 2014-02-25 | Discharge: 2014-02-28 | DRG: 640 | Disposition: A | Discharge status: Skilled Nursing Facility | Payer: Medicare Other | Attending: Family Medicine | Admitting: Family Medicine

## 2014-02-25 DIAGNOSIS — G9340 Encephalopathy, unspecified: Secondary | ICD-10-CM | POA: Diagnosis present

## 2014-02-25 DIAGNOSIS — Z515 Encounter for palliative care: Secondary | ICD-10-CM

## 2014-02-25 DIAGNOSIS — R000 Tachycardia, unspecified: Secondary | ICD-10-CM

## 2014-02-25 DIAGNOSIS — Z9981 Dependence on supplemental oxygen: Secondary | ICD-10-CM

## 2014-02-25 DIAGNOSIS — Z7401 Bed confinement status: Secondary | ICD-10-CM

## 2014-02-25 DIAGNOSIS — R4189 Other symptoms and signs involving cognitive functions and awareness: Secondary | ICD-10-CM | POA: Diagnosis present

## 2014-02-25 DIAGNOSIS — M4120 Other idiopathic scoliosis, site unspecified: Secondary | ICD-10-CM | POA: Diagnosis present

## 2014-02-25 DIAGNOSIS — I10 Essential (primary) hypertension: Secondary | ICD-10-CM | POA: Diagnosis present

## 2014-02-25 DIAGNOSIS — Z8673 Personal history of transient ischemic attack (TIA), and cerebral infarction without residual deficits: Secondary | ICD-10-CM

## 2014-02-25 DIAGNOSIS — J4489 Other specified chronic obstructive pulmonary disease: Secondary | ICD-10-CM | POA: Diagnosis present

## 2014-02-25 DIAGNOSIS — R627 Adult failure to thrive: Secondary | ICD-10-CM | POA: Diagnosis present

## 2014-02-25 DIAGNOSIS — Z66 Do not resuscitate: Secondary | ICD-10-CM | POA: Diagnosis present

## 2014-02-25 DIAGNOSIS — J449 Chronic obstructive pulmonary disease, unspecified: Secondary | ICD-10-CM | POA: Diagnosis present

## 2014-02-25 DIAGNOSIS — Z681 Body mass index (BMI) 19 or less, adult: Secondary | ICD-10-CM

## 2014-02-25 DIAGNOSIS — M129 Arthropathy, unspecified: Secondary | ICD-10-CM | POA: Diagnosis present

## 2014-02-25 DIAGNOSIS — J690 Pneumonitis due to inhalation of food and vomit: Secondary | ICD-10-CM | POA: Diagnosis present

## 2014-02-25 DIAGNOSIS — E43 Unspecified severe protein-calorie malnutrition: Secondary | ICD-10-CM | POA: Diagnosis present

## 2014-02-25 DIAGNOSIS — IMO0002 Reserved for concepts with insufficient information to code with codable children: Secondary | ICD-10-CM | POA: Diagnosis present

## 2014-02-25 DIAGNOSIS — E860 Dehydration: Principal | ICD-10-CM | POA: Diagnosis present

## 2014-02-25 LAB — CBC
HCT: 34.4 % — ABNORMAL LOW (ref 36.0–46.0)
HEMOGLOBIN: 10.9 g/dL — AB (ref 12.0–15.0)
MCH: 29.1 pg (ref 26.0–34.0)
MCHC: 31.7 g/dL (ref 30.0–36.0)
MCV: 91.7 fL (ref 78.0–100.0)
Platelets: 266 10*3/uL (ref 150–400)
RBC: 3.75 MIL/uL — ABNORMAL LOW (ref 3.87–5.11)
RDW: 16.5 % — ABNORMAL HIGH (ref 11.5–15.5)
WBC: 16 10*3/uL — ABNORMAL HIGH (ref 4.0–10.5)

## 2014-02-25 LAB — BASIC METABOLIC PANEL
BUN: 28 mg/dL — AB (ref 6–23)
CO2: 37 mEq/L — ABNORMAL HIGH (ref 19–32)
CREATININE: 0.29 mg/dL — AB (ref 0.50–1.10)
Calcium: 9 mg/dL (ref 8.4–10.5)
Chloride: 101 mEq/L (ref 96–112)
GFR calc Af Amer: >90 mL/min (ref >90)
GLUCOSE: 163 mg/dL — AB (ref 70–99)
POTASSIUM: 3.9 meq/L (ref 3.7–5.3)
Sodium: 145 mEq/L (ref 137–147)

## 2014-02-25 NOTE — ED Notes (Signed)
Pt cbg was 185

## 2014-02-25 NOTE — ED Notes (Signed)
Pt found unresponsive by family members at nursing facility. Pt is dnr and family wanted pt seen here at facility. Pt responded a little to an ammonia inhalant per ems.

## 2014-02-25 NOTE — ED Provider Notes (Signed)
CSN: [id number]     Arrival date & time 02/25/14  2159 History  This chart was scribed for Sara RollerBrian D Trenyce Loera, MD by Danella Maiersaroline Early, ED Scribe. This patient was seen in room APA15/APA15 and the patient's care was started at 10:56 PM.    Chief Complaint  Patient presents with  . Altered Mental Status   The history is provided by a relative. The history is limited by the condition of the patient. No language interpreter was used.  Level 5 Caveat - condition of the pt HPI Comments: Sara GroomsBarbara F [name] is a 80 y.o. female with a h/o COPD, stroke,  HTN who presents to the Emergency Department complaining of AMS onset today. Daughter states she went to visit the pt at the nursing home around 9:30pm and pt was hunched over, food in her lap, labored breathing, and unresponsive. Daughter states she saw the pt yesterday morning and she was her normal self. Daughter states her husband visited the pt at lunchtime today and tried to make conversation but pt just kept asking "what?".  Daughter reports acute wasting over the last few months but today was a severe decline in responsiveness. She is on home oxygen all the time.   Past Medical History  Diagnosis Date  . Hypertension   . Scoliosis   . Arthritis   . COPD (chronic obstructive pulmonary disease)    Past Surgical History  Procedure Laterality Date  . Knee surgery     History reviewed. No pertinent family history. History  Substance Use Topics  . Smoking status: Never Smoker   . Smokeless tobacco: Not on file  . Alcohol Use: No   OB History   Grav Para Term Preterm Abortions TAB SAB Ect Mult Living                 Review of Systems  Unable to perform ROS: Acuity of condition      Allergies  Review of patient's allergies indicates no known allergies.  Home Medications   Prior to Admission medications   Medication Sig Start Date End Date Taking? Authorizing Provider  cloNIDine (CATAPRES) 0.1 MG tablet Take 0.1 mg by mouth 3 (three)  times daily.   Yes Historical Provider, MD  megestrol (MEGACE) 400 MG/10ML suspension Take 10 mLs (400 mg total) by mouth daily. 01/27/14  Yes Angus Edilia BoG McInnis, MD  metoprolol tartrate (LOPRESSOR) 25 MG tablet Take 1 tablet (25 mg total) by mouth 2 (two) times daily. 01/27/14  Yes Angus Edilia BoG McInnis, MD  Multiple Vitamin (MULTIVITAMIN WITH MINERALS) TABS tablet Take 1 tablet by mouth daily.   Yes Historical Provider, MD  Nutritional Supplements (RESOURCE 2.0) LIQD Take 120 mLs by mouth 3 (three) times daily.   Yes Historical Provider, MD  traMADol (ULTRAM) 50 MG tablet Take 50 mg by mouth every 6 (six) hours as needed. pain    Historical Provider, MD   BP 130/65  Pulse 82  Resp 26  Wt 55 lb (24.948 kg)  SpO2 100% Physical Exam  Nursing note and vitals reviewed. Constitutional: She appears well-developed and well-nourished. No distress.  cachectic  HENT:  Head: Normocephalic and atraumatic.  Eyes: Conjunctivae are normal. Right eye exhibits no discharge. Left eye exhibits no discharge.  Cardiovascular: Normal rate and regular rhythm.   No murmur heard. Pulmonary/Chest: Breath sounds normal. She is in respiratory distress.  Increased effort with breathing  Abdominal: Soft. She exhibits no distension. There is no tenderness. There is no rebound and no guarding.  Musculoskeletal: She exhibits no edema and no tenderness.  Severely kyphotic.   Lymphadenopathy:    She has no cervical adenopathy.  Neurological:  Unresponsive to painful stimuli, breathing spontaneously, remains in a flexed left lateral decubitus position, does not follow commands, does not open eyes, does not speak  Skin: Skin is warm. No rash noted.    ED Course  Procedures (including critical care time) Medications - No data to display  DIAGNOSTIC STUDIES: Oxygen Saturation is 100% on [place], normal by my interpretation.    COORDINATION OF CARE: 11:06 PM- Discussed treatment plan with pt. Pt agrees to plan.    Labs  Review Labs Reviewed  CBC - Abnormal; Notable for the following:    WBC 16.0 (*)    RBC 3.75 (*)    Hemoglobin 10.9 (*)    HCT 34.4 (*)    RDW 16.5 (*)    All other components within normal limits  BASIC METABOLIC PANEL - Abnormal; Notable for the following:    CO2 37 (*)    Glucose, Bld 163 (*)    BUN 28 (*)    Creatinine, Ser 0.29 (*)    All other components within normal limits  URINALYSIS, ROUTINE W REFLEX MICROSCOPIC - Abnormal; Notable for the following:    Specific Gravity, Urine >1.030 (*)    Protein, ur 100 (*)    All other components within normal limits  URINE MICROSCOPIC-ADD ON - Abnormal; Notable for the following:    Bacteria, UA MANY (*)    Casts HYALINE CASTS (*)    All other components within normal limits    Imaging Review Dg Chest Port 1 View  02/25/2014   CLINICAL DATA:  Patient found unresponsive  EXAM: PORTABLE CHEST - 1 VIEW  COMPARISON:  DG CHEST 1V PORT dated 01/23/2014  FINDINGS: Limited examination secondary to technique. Patient is rotated. There is no definite focal consolidation, pleural effusion or pneumothorax. Stable cardiomegaly. Severe generalized osteopenia.  IMPRESSION: Limited examination secondary to technique. No definite acute cardiopulmonary disease.   Electronically Signed   By: Elige KoHetal  Patel   On: 02/25/2014 23:48    ED ECG REPORT  I personally interpreted this EKG   Date: 02/26/2014   Rate: 81  Rhythm: normal sinus rhythm  QRS Axis: normal  Intervals: normal  ST/T Wave abnormalities: nonspecific ST/T changes  Conduction Disutrbances:right bundle branch block  Narrative Interpretation:   Old EKG Reviewed: none available   MDM   Final diagnoses:  Acute encephalopathy    The patient is ill-appearing, she has evidence of altered mental status which is concerning however the patient is also undergoing end-of-life transitional care. The family states they do not want any heroic measures but would rather her have comfort care.  Urinalysis without infection, chest x-ray without obvious pneumonia and labs show leukocytosis but no other significant findings. Discussed with hospitalist who will admit for palliative care    I personally performed the services described in this documentation, which was scribed in my presence. The recorded information has been reviewed and is accurate.      Sara RollerBrian D Dequavious Harshberger, MD 02/26/14 [phone]

## 2014-02-25 NOTE — ED Notes (Signed)
Family at bedside. Patient not alert to surroundings. Not able to verbalize to staff any concerns.

## 2014-02-26 DIAGNOSIS — G9340 Encephalopathy, unspecified: Secondary | ICD-10-CM | POA: Diagnosis present

## 2014-02-26 DIAGNOSIS — E43 Unspecified severe protein-calorie malnutrition: Secondary | ICD-10-CM

## 2014-02-26 DIAGNOSIS — E860 Dehydration: Principal | ICD-10-CM

## 2014-02-26 DIAGNOSIS — R000 Tachycardia, unspecified: Secondary | ICD-10-CM

## 2014-02-26 DIAGNOSIS — R4189 Other symptoms and signs involving cognitive functions and awareness: Secondary | ICD-10-CM | POA: Diagnosis present

## 2014-02-26 DIAGNOSIS — R404 Transient alteration of awareness: Secondary | ICD-10-CM

## 2014-02-26 LAB — URINALYSIS, ROUTINE W REFLEX MICROSCOPIC
BILIRUBIN URINE: NEGATIVE
Glucose, UA: NEGATIVE mg/dL
HGB URINE DIPSTICK: NEGATIVE
KETONES UR: NEGATIVE mg/dL
Leukocytes, UA: NEGATIVE
Nitrite: NEGATIVE
Protein, ur: 100 mg/dL — AB
UROBILINOGEN UA: 0.2 mg/dL (ref 0.0–1.0)
pH: 5.5 (ref 5.0–8.0)

## 2014-02-26 LAB — URINE MICROSCOPIC-ADD ON

## 2014-02-26 MED ORDER — ONDANSETRON HCL 4 MG PO TABS: 4.0000 mg | ORAL_TABLET | Freq: Four times a day (QID) | ORAL | Status: DC | PRN

## 2014-02-26 MED ORDER — MORPHINE SULFATE 2 MG/ML IJ SOLN: 1.0000 mg | INTRAMUSCULAR | Status: DC | PRN

## 2014-02-26 MED ORDER — LEVOFLOXACIN IN D5W 750 MG/150ML IV SOLN: 750.0000 mg | Freq: Once | INTRAVENOUS | Status: DC

## 2014-02-26 MED ORDER — PIPERACILLIN-TAZOBACTAM IN DEX 2-0.25 GM/50ML IV SOLN
2.2500 g | Freq: Three times a day (TID) | INTRAVENOUS | Status: DC
  Administered 2014-02-26 – 2014-02-28 (×5): 2.25 g via INTRAVENOUS
  Filled 2014-02-26 (×13): qty 50

## 2014-02-26 MED ORDER — ONDANSETRON HCL 4 MG/2ML IJ SOLN: 4.0000 mg | Freq: Three times a day (TID) | INTRAMUSCULAR | Status: DC | PRN

## 2014-02-26 MED ORDER — PIPERACILLIN-TAZOBACTAM IN DEX 2-0.25 GM/50ML IV SOLN
INTRAVENOUS | Status: AC
  Filled 2014-02-26: qty 100

## 2014-02-26 MED ORDER — SODIUM CHLORIDE 0.9 % IV SOLN
INTRAVENOUS | Status: AC
  Administered 2014-02-26: 08:00:00 via INTRAVENOUS

## 2014-02-26 MED ORDER — PIPERACILLIN-TAZOBACTAM IN DEX 2-0.25 GM/50ML IV SOLN
2.2500 g | Freq: Two times a day (BID) | INTRAVENOUS | Status: DC
  Administered 2014-02-26: 2.25 g via INTRAVENOUS
  Filled 2014-02-26 (×3): qty 50

## 2014-02-26 MED ORDER — ONDANSETRON HCL 4 MG/2ML IJ SOLN: 4.0000 mg | Freq: Four times a day (QID) | INTRAMUSCULAR | Status: DC | PRN

## 2014-02-26 NOTE — Progress Notes (Signed)
INITIAL NUTRITION ASSESSMENT  DOCUMENTATION CODES Per approved criteria  -Severe malnutrition in the context of chronic illness   Pt meets criteria for severe MALNUTRITION in the context of chronic illness as evidenced by severe muscle depletion, 26% wt loss x 6 months, severe muscle depletion.  INTERVENTION: Follow for diet advancement and goals of care  NUTRITION DIAGNOSIS: Inadequate oral intake related to decreased appetite as evidenced by NPO, 26% wt loss x 6 months, severe muscle depletion.   Goal: Pt will meet nutrition needs as able  Monitor:  Goals of care  Reason for Assessment: MST=3, low braden=12  80 y.o. female  Admitting Dx: Unresponsive state  ASSESSMENT: Pt familiar to this RD due to recent d/c from [facility] on 01/27/14. Pt with poor po intake, multiple pressure ulcers, and hx of wt loss during previous admission. Pt was diagnosed with severe malnutrition at previous admission. Pt was discharged to [facility] after previous hospitalization. Pt admitted due to acute encephalopathy. Pt has been unresponsive x 2 days. Pt also with decreased PO intake PTA and has been struggling with aspiration pneumonia. Pt was orginally on a pureed diet at [facility], but was upgraded to a mechanical soft diet. Also received MVI, Megace, and Resource 2.0 BID at [facility] for additional nutrition support.  Wt hx reveals consinued weight loss since last admission. Noted a 1.7% wt loss x 1 month and a 26% wt loss x 6 months (which is clinically significant).  No family present at time of visit and pt would not respond to this RD. Unable to complete full physical exam, due to pt's current condition.  Nutrition Focused Physical Exam:  Subcutaneous Fat:  Orbital Region: severe depletion Upper Arm Region: unable to assess Thoracic and Lumbar Region: unable to assess  Muscle:  Temple Region: severe depletion Clavicle Bone Region: severe depletion Clavicle and Acromion Bone Region: unable to  assess Scapular Bone Region: unable to assess Dorsal Hand: unable to assess Patellar Region: unable to assess Anterior Thigh Region: unable to assess Posterior Calf Region: unable to assess  Edema: unable to assess  Per H&P, pt family wants minimal interventions for pt. If pt condition does not improve within the next few days, pt will likely transition to comfort care.   Height: Ht Readings from Last 1 Encounters:  02/26/14 4' 11.84" (1.52 m)    Weight: Wt Readings from Last 1 Encounters:  02/26/14 57 lb 11.2 oz (26.173 kg)    Ideal Body Weight: 100#  % Ideal Body Weight: 57%  Wt Readings from Last 10 Encounters:  02/26/14 57 lb 11.2 oz (26.173 kg)  01/23/14 58 lb 8 oz (26.535 kg)  08/13/13 77 lb (34.927 kg)  06/17/13 75 lb (34.02 kg)  05/11/13 70 lb (31.752 kg)  04/18/13 80 lb (36.288 kg)  11/17/12 81 lb 9.1 oz (37 kg)    Usual Body Weight: 81#  % Usual Body Weight: 70%  BMI:  Body mass index is 11.33 kg/(m^2). Meets criteria for underweight.   Estimated Nutritional Needs: Kcal: 1280-1410 daily Protein: 33-39 grams daily Fluid: 1.3-1.4 L daily  Skin: WDL  Diet Order: NPO  EDUCATION NEEDS: -Education not appropriate at this time  No intake or output data in the 24 hours ending 02/26/14 1243  Last BM: PTA   Labs:   Recent Labs Lab 02/25/14 2321  NA 145  K 3.9  CL 101  CO2 37*  BUN 28*  CREATININE 0.29*  CALCIUM 9.0  GLUCOSE 163*    CBG (last 3)  No results found for this basename: GLUCAP,  in the last 72 hours  Scheduled Meds: . piperacillin-tazobactam (ZOSYN)  IV  2.25 g Intravenous Q8H    Continuous Infusions: . sodium chloride 50 mL/hr at 02/26/14 0800    Past Medical History  Diagnosis Date  . Hypertension   . Scoliosis   . Arthritis   . COPD (chronic obstructive pulmonary disease)     Past Surgical History  Procedure Laterality Date  . Knee surgery      Dominique Calvey A. Mayford KnifeWilliams, RD, LDN Pager: [phone]

## 2014-02-26 NOTE — Care Management Note (Addendum)
    Page 1 of 1   02/27/2014     11:10:41 AM CARE MANAGEMENT NOTE 02/27/2014  Patient:  Sara [name],Sara [name]   Account Number:  [id number]  Date Initiated:  02/26/2014  Documentation initiated by:  Anibal HendersonBOLDEN,GENEVA  Subjective/Objective Assessment:   Admitted from [facility].  Pt with AMS,  becoming less responsive over last  2-3 days/ unclear etiology.     Action/Plan:   CSW involved   Anticipated DC Date:  02/28/2014   Anticipated DC Plan:  SKILLED NURSING FACILITY  In-house referral  Clinical Social Worker      DC Planning Services  CM consult      Choice offered to / List presented to:             Status of service:  Completed, signed off Medicare Important Message given?   (If response is "NO", the following Medicare IM given date fields will be blank) Date Medicare IM given:   Date Additional Medicare IM given:    Discharge Disposition:  SKILLED NURSING FACILITY  Per UR Regulation:    If discussed at Long Length of Stay Meetings, dates discussed:    Comments:  02/26/14 1700 Anibal HendersonGeneva Bolden RN/CM
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
## 2014-02-26 NOTE — Progress Notes (Signed)
Subjective: The patient became somewhat unresponsive in nursing facility and was brought to emergency room where she was evaluated and subsequently was admitted. She was placed on IV antibiotics because of possibility of aspiration pneumonia. He is also receiving intravenous fluids. She responds to spoken voice. Patient is DO NOT RESUSCITATE  Objective: Vital signs in last 24 hours: Temp:  [98.1 F (36.7 C)-99.3 F (37.4 C)] 98.1 F (36.7 C) (04/16 1336) Pulse Rate:  [59-119] 119 (04/16 1336) Resp:  [8-26] 18 (04/16 1336) BP: (93-139)/(45-88) 130/88 mmHg (04/16 1336) SpO2:  [93 %-100 %] 93 % (04/16 1336) Weight:  [24.948 kg (55 lb)-26.173 kg (57 lb 11.2 oz)] 26.173 kg (57 lb 11.2 oz) (04/16 0647) Weight change:     Intake/Output from previous day:   Intake/Output this shift:    Physical Exam: The patient appears to be in no distress  HEENT negative  Neck supple no JVD or thyroid abnormalities  Lungs clear to P&A  Heart regular rhythm no murmurs  Abdomen the palpable organs or masses extremities extremely cachectic  Recent Labs  02/25/14 2321  WBC 16.0*  HGB 10.9*  HCT 34.4*  PLT 266   BMET  Recent Labs  02/25/14 2321  NA 145  K 3.9  CL 101  CO2 37*  GLUCOSE 163*  BUN 28*  CREATININE 0.29*  CALCIUM 9.0    Studies/Results: Dg Chest Port 1 View  02/25/2014   CLINICAL DATA:  Patient found unresponsive  EXAM: PORTABLE CHEST - 1 VIEW  COMPARISON:  DG CHEST 1V PORT dated 01/23/2014  FINDINGS: Limited examination secondary to technique. Patient is rotated. There is no definite focal consolidation, pleural effusion or pneumothorax. Stable cardiomegaly. Severe generalized osteopenia.  IMPRESSION: Limited examination secondary to technique. No definite acute cardiopulmonary disease.   Electronically Signed   By: Elige KoHetal  Patel   On: 02/25/2014 23:48    Medications:  . piperacillin-tazobactam (ZOSYN)  IV  2.25 g Intravenous Q8H    . sodium chloride 50 mL/hr at  02/26/14 0800     Assessment/Plan: 1. Patient is cachectic and is dehydrated  2. Possible aspiration pneumonia plan to continue current IV antibiotics continue current intravenous fluids  Reginaldo Hazard G Melady Chow 02/26/2014, 4:33 PM

## 2014-02-26 NOTE — Progress Notes (Signed)
UR completed. Patient changed to inpatient- requiring IV antibiotics.  

## 2014-02-26 NOTE — H&P (Signed)
PCP:   Alice ReichertMCINNIS,ANGUS G, MD   Chief Complaint:  unresponsive  HPI: 80 yo female with h/o severe cachexia, FTT over the last year with significant weight loss, copd, htn has been at [facility] for last several months comes in with several days of worsening responsiveness.  Pt has 5 children, all active in her care.  Daughter is present.  A family member visits her almost every day.  All history obtained from daughter.  The last 2 days pt has had decreased po intake, and much less responsive.  Tonight when she went to visit her mom, found mom slumped over in her bed unresponsive.  No report of fevers.  No n/v/d.  About 2 weeks ago, family was told she was aspirating and her diet was changed to a pureed diet then to a mechanical soft diet.  She is bedbound.  On a good day, which occurs several days a week she is interactive with her family and still recognizes her children.  She does have a living will which expresses no extreme measures, no feeding tubes.  She is DNR/I and MOST form present signed by dr Renard Mattermcinnis her pcp.  Family does not wish to put her through anything, do not want a ct head.  Do not want to find out if she had a heart attack.  They want minimal done.  They want a trial of abx and ivf if it can help.    Review of Systems:  Positive and negative as per HPI otherwise all other systems are negative per dtr.  Past Medical History: Past Medical History  Diagnosis Date  . Hypertension   . Scoliosis   . Arthritis   . COPD (chronic obstructive pulmonary disease)    Past Surgical History  Procedure Laterality Date  . Knee surgery      Medications: Prior to Admission medications   Medication Sig Start Date End Date Taking? Authorizing Provider  cloNIDine (CATAPRES) 0.1 MG tablet Take 0.1 mg by mouth 3 (three) times daily.   Yes Historical Provider, MD  megestrol (MEGACE) 400 MG/10ML suspension Take 10 mLs (400 mg total) by mouth daily. 01/27/14  Yes Angus Edilia BoG McInnis, MD  metoprolol  tartrate (LOPRESSOR) 25 MG tablet Take 1 tablet (25 mg total) by mouth 2 (two) times daily. 01/27/14  Yes Angus Edilia BoG McInnis, MD  Multiple Vitamin (MULTIVITAMIN WITH MINERALS) TABS tablet Take 1 tablet by mouth daily.   Yes Historical Provider, MD  Nutritional Supplements (RESOURCE 2.0) LIQD Take 120 mLs by mouth 3 (three) times daily.   Yes Historical Provider, MD  traMADol (ULTRAM) 50 MG tablet Take 50 mg by mouth every 6 (six) hours as needed. pain    Historical Provider, MD    Allergies:  No Known Allergies  Social History:  reports that she has never smoked. She does not have any smokeless tobacco history on file. She reports that she does not drink alcohol or use illicit drugs.  Family History: History reviewed. No pertinent family history.  Physical Exam: Filed Vitals:   02/26/14 0000 02/26/14 0100 02/26/14 0200 02/26/14 0300  BP: 95/51 93/48 97/45  95/45  Pulse: 69 66 59   Resp: 22 20 8 8   Weight:      SpO2:       General appearance: no distress  Extreme cachectic Head: Normocephalic, without obvious abnormality, atraumatic Eyes: negative Nose: Nares normal. Septum midline. Mucosa normal. No drainage or sinus tenderness. Neck: no JVD and supple, symmetrical, trachea midline Lungs: clear to auscultation  bilaterally Heart: regular rate and rhythm, S1, S2 normal, no murmur, click, rub or gallop Abdomen: soft, non-tender; bowel sounds normal; no masses,  no organomegaly Extremities: extremities normal, atraumatic, no cyanosis or edema Pulses: 2+ and symmetric Skin: Skin color, texture, turgor normal. No rashes or lesions Neurologic: unresponsive, does not follow commands.      Labs on Admission:   Recent Labs  02/25/14 2321  NA 145  K 3.9  CL 101  CO2 37*  GLUCOSE 163*  BUN 28*  CREATININE 0.29*  CALCIUM 9.0    Recent Labs  02/25/14 2321  WBC 16.0*  HGB 10.9*  HCT 34.4*  MCV 91.7  PLT 266   Radiological Exams on Admission: Dg Chest Port 1  View  02/25/2014   CLINICAL DATA:  Patient found unresponsive  EXAM: PORTABLE CHEST - 1 VIEW  COMPARISON:  DG CHEST 1V PORT dated 01/23/2014  FINDINGS: Limited examination secondary to technique. Patient is rotated. There is no definite focal consolidation, pleural effusion or pneumothorax. Stable cardiomegaly. Severe generalized osteopenia.  IMPRESSION: Limited examination secondary to technique. No definite acute cardiopulmonary disease.   Electronically Signed   By: Elige KoHetal  Patel   On: 02/25/2014 23:48    Assessment/Plan  80 yo female with AMS from unclear etiology  Principal Problem:   Unresponsive state-  ddx include major cva, mi, possible aspiration or just dehydration.  Family wish to do gentle ivf overnight along with abx to cover for possible developing aspiration pna.  If no improvement in next day or so, want to move to comfort care only.  They wish to arrange a family meeting in the next couple of days.  Place on zosyn iv and ns 50cc/hr for now.  DNR/I.   Active Problems:   Dehydration   Failure to thrive   Protein-calorie malnutrition, severe    Jochebed Bills A Retta Pitcher 02/26/2014, 3:20 AM

## 2014-02-26 NOTE — Progress Notes (Signed)
80 yo frail F admitted with FTT, possible aspiration PNA.  Family desires a trial of antibiotics.    Zosyn started.  Estimated CrCl ~ 7020ml/min.   Zosyn 2.25gm IV q8h per manufacturer recommendations.  Junita PushMichelle Namari Breton, PharmD, BCPS 02/26/2014@11 :59 AM

## 2014-02-27 MED ORDER — CLONIDINE HCL 0.1 MG PO TABS
0.1000 mg | ORAL_TABLET | Freq: Three times a day (TID) | ORAL | Status: DC
  Administered 2014-02-27 – 2014-02-28 (×3): 0.1 mg via ORAL
  Filled 2014-02-27 (×3): qty 1

## 2014-02-27 NOTE — Clinical Social Work Note (Signed)
[facility] admissions updated, willing to accept patient return over weekend.    Santa GeneraAnne Lourene Hoston, LCSW Clinical Social Worker [phone])

## 2014-02-27 NOTE — Clinical Documentation Improvement (Signed)
Possible Clinical Conditions?  Acute Respiratory Failure Acute on Chronic Respiratory Failure Chronic Respiratory Failure   Other Condition Cannot Clinically Determine   Supporting Information: Risk Factors:h/o COPD, stroke, HTN, found unresponsive  Signs & Symptoms: ED notes: "labored breathing, and unresponsive.Marland Kitchen.Marland Kitchen.She is on home oxygen all the time.She is in respiratory distress.  Increased effort with breathing   Diagnostics:02/26/14 0200   02/26/14 0300                             BP: 97/45              95/45                            Pulse: 59                           Resp: 8                    8   Treatment: O2 therapy per Non- rebreather on 02/25/14 sats 100%  and  nasal cannula sats 93-100% Comfort care  Thank Barrie DunkerYou, Nyriah Coote C Jacquan Savas ,RN Clinical Documentation Specialist:  [phone]  [facility]- Health Information Management

## 2014-02-27 NOTE — Clinical Documentation Improvement (Signed)
Possible Clinical Conditions?  Functional quadriplegia Functional quadriparesis  ____Other Condition__________________ ____Cannot Clinically Determine   Supporting Information: H&P 02/26/14: "She is bedbound" Severely kyphotic. undergoing end-of-life transitional care  Risk Factors: PMH: major cva, mi Scoliosis, Arthritis , Wt 55 lb, cachectic   Treatment: Comfort care, Hydration, Safety precautions, Monitor. Family meeting for goal of care.  Thank You, Sara GaussGarnet C Braden Cimo ,RN Clinical Documentation Specialist:  [phone]  [facility]- Health Information Management

## 2014-02-27 NOTE — Clinical Social Work Note (Signed)
CSW spoke w [facility], needs discharge summary by 4 PM in order to arrange medications.  MD informed, will discharge tomorrow am.  Santa GeneraAnne Cunningham, LCSW Clinical Social Worker [phone])

## 2014-02-28 NOTE — Discharge Summary (Signed)
Physician Discharge Summary  Sara GroomsBarbara F [name] OZH:[id number] DOB: 01-27-1926 DOA: 02/25/2014  PCP: Alice ReichertMCINNIS,Sara Veno G, MD  Admit date: 02/25/2014 Discharge date: 02/28/2014     Discharge Diagnoses:  1. Possible aspiration pneumonia 2. Cachexia, dehydration 3. COPD with chronic respiratory distress 4. Essential hypertension 5.   Discharge Condition: Fair Disposition: Discharge to nursing facility  Diet recommendation: Regular with food supplements  Filed Weights   02/25/14 2204 02/26/14 0647 02/27/14 0432  Weight: 24.948 kg (55 lb) 26.173 kg (57 lb 11.2 oz) 23.768 kg (52 lb 6.4 oz)    History of present illness:  The patient was brought to the emergency room from nursing facility. She did have significant weight loss does have COPD hypertension. Become less responsive prior to admission here and she was told she was aspirating her food was changed to soft diet. She is a DO NOT RESUSCITATE and family was adamant about wishing no extreme measures for treatment. She was placed on IV fluids started on IV Zosyn and subsequently admitted Hospital Course:  The patient was started on IV normal saline and Zosyn intravenously. She was continued on nasal oxygen, Megace 400 mg daily, multivitamins resource 3 times a day. She had to be given Lopressor 25 mg twice a day and clonidine 0.1 mg every 8 hours because of elevated blood pressure. The patient remained fairly alert during her hospital stay but his complete bed patient requiring nursing care. After 3 days hospitalization it was felt she could be transferred back to nursing facility with continued comfort care measures. She is to continue the following medications.   Discharge Instructions The patient is to continue the following medications and dietary supplements    Medication List         cloNIDine 0.1 MG tablet  Commonly known as:  CATAPRES  Take 0.1 mg by mouth 3 (three) times daily.     megestrol 400 MG/10ML suspension  Commonly  known as:  MEGACE  Take 10 mLs (400 mg total) by mouth daily.     metoprolol tartrate 25 MG tablet  Commonly known as:  LOPRESSOR  Take 1 tablet (25 mg total) by mouth 2 (two) times daily.     multivitamin with minerals Tabs tablet  Take 1 tablet by mouth daily.     RESOURCE 2.0 Liqd  Take 120 mLs by mouth 3 (three) times daily.     traMADol 50 MG tablet  Commonly known as:  ULTRAM  Take 50 mg by mouth every 6 (six) hours as needed. pain       No Known Allergies  The results of significant diagnostics from this hospitalization (including imaging, microbiology, ancillary and laboratory) are listed below for reference.    Significant Diagnostic Studies: Dg Chest Port 1 View  02/25/2014   CLINICAL DATA:  Patient found unresponsive  EXAM: PORTABLE CHEST - 1 VIEW  COMPARISON:  DG CHEST 1V PORT dated 01/23/2014  FINDINGS: Limited examination secondary to technique. Patient is rotated. There is no definite focal consolidation, pleural effusion or pneumothorax. Stable cardiomegaly. Severe generalized osteopenia.  IMPRESSION: Limited examination secondary to technique. No definite acute cardiopulmonary disease.   Electronically Signed   By: Elige KoHetal  Patel   On: 02/25/2014 23:48    Microbiology: No results found for this or any previous visit (from the past 240 hour(s)).   Labs: Basic Metabolic Panel:  Recent Labs Lab 02/25/14 2321  NA 145  K 3.9  CL 101  CO2 37*  GLUCOSE 163*  BUN 28*  CREATININE  0.29*  CALCIUM 9.0   Liver Function Tests: No results found for this basename: AST, ALT, ALKPHOS, BILITOT, PROT, ALBUMIN,  in the last 168 hours No results found for this basename: LIPASE, AMYLASE,  in the last 168 hours No results found for this basename: AMMONIA,  in the last 168 hours CBC:  Recent Labs Lab 02/25/14 2321  WBC 16.0*  HGB 10.9*  HCT 34.4*  MCV 91.7  PLT 266   Cardiac Enzymes: No results found for this basename: CKTOTAL, CKMB, CKMBINDEX, TROPONINI,  in the  last 168 hours BNP: BNP (last 3 results)  Recent Labs  08/13/13 1452  PROBNP 353.1   CBG: No results found for this basename: GLUCAP,  in the last 168 hours  Principal Problem:   Unresponsive state Active Problems:   Dehydration   Failure to thrive   Protein-calorie malnutrition, severe   Acute encephalopathy   Unresponsive   Time coordinating discharge: 30 minutes Signed:  Butch PennyAngus Raphael Espe, MD 02/28/2014, 7:32 AM

## 2014-03-13 DEATH — deceased

## 2014-08-31 IMAGING — CT CT CERVICAL SPINE W/O CM
4 of 5 series · 13 of 27 positions shown, 15 images · non-contrast
Comparison: No priors.

CLINICAL DATA: History of trauma from a fall. Small laceration
around the right eye. Weakness and altered level of consciousness.

EXAM:
CT HEAD WITHOUT CONTRAST
CT CERVICAL SPINE WITHOUT CONTRAST
TECHNIQUE: Multidetector CT imaging of the head and cervical spine was
performed following the standard protocol without intravenous
contrast. Multiplanar CT image reconstructions of the cervical spine
were also generated.

[Series 2: headseq 4.8 h37s · axial · 0.42mm/px · z∈[+151,+291]mm · 3 of 30 slices shown, 4 images]
[im 1/30  soft-tissue]
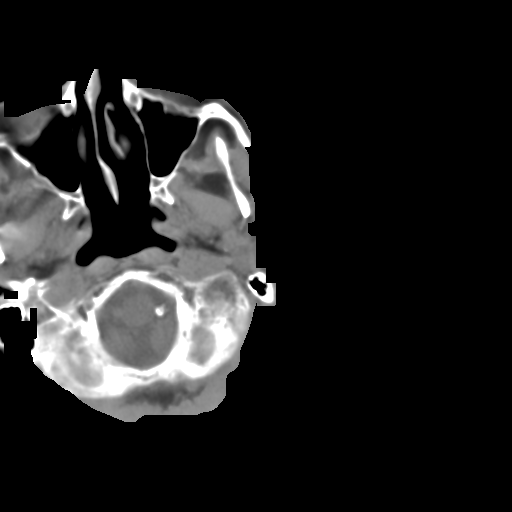
[im 1/30  bone]
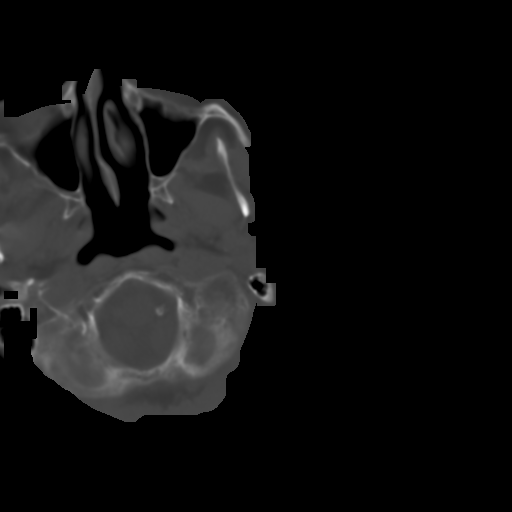
[im 15/30  bone]
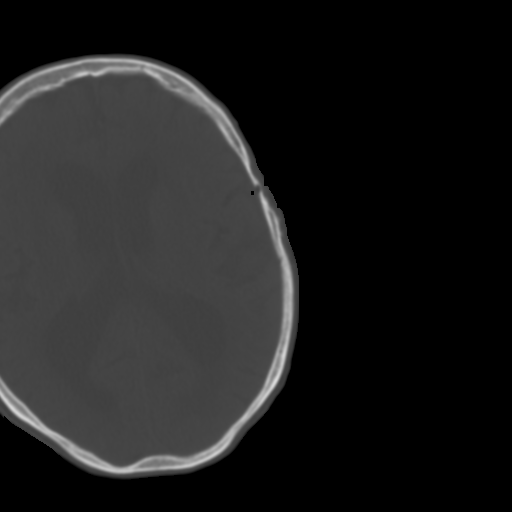
[im 30/30  bone]
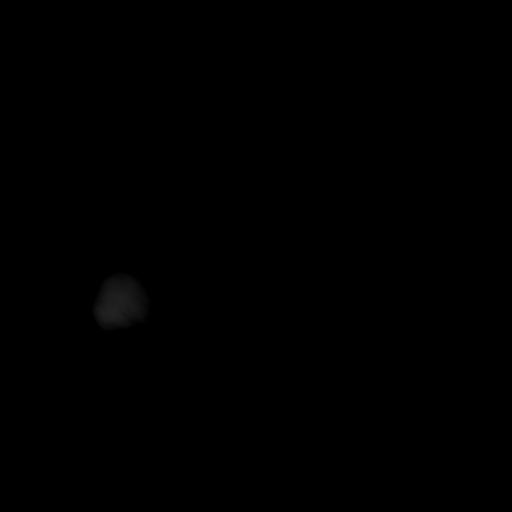

[Series 6: cervical st 2.0 b31s · axial · 0.43mm/px · z∈[+68,+108]mm · 2 of 60 slices shown]
[im 20/60  bone]
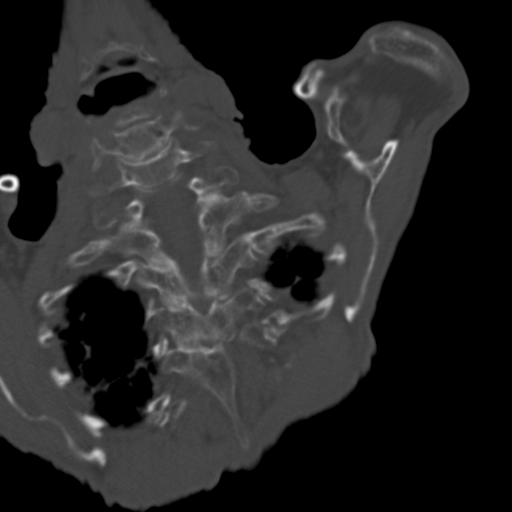
[im 40/60  bone]
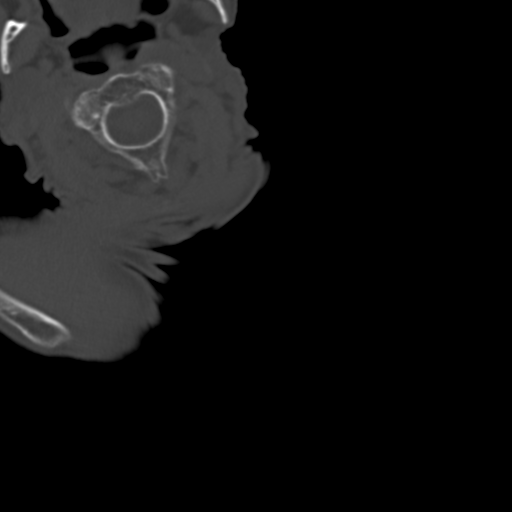

[Series 8: sagittal bone 2.0 · sagittal · 0.30mm/px · 5 of 59 slices shown, 6 images]
[im 20/59  bone]
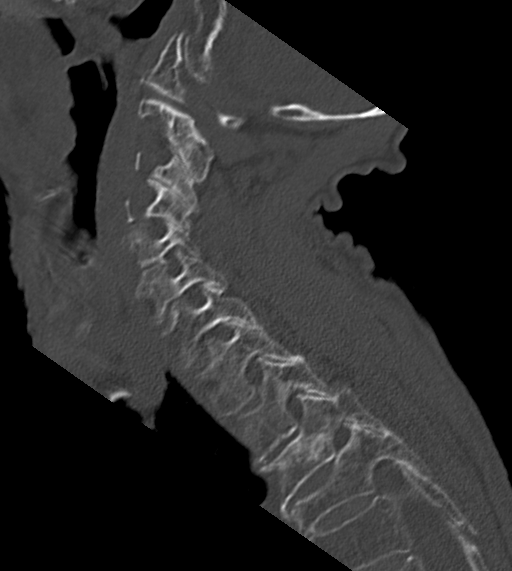
[im 25/59  bone]
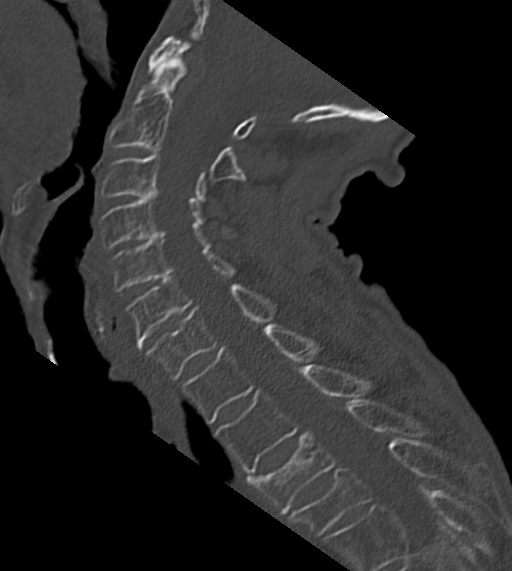
[im 30/59  soft-tissue]
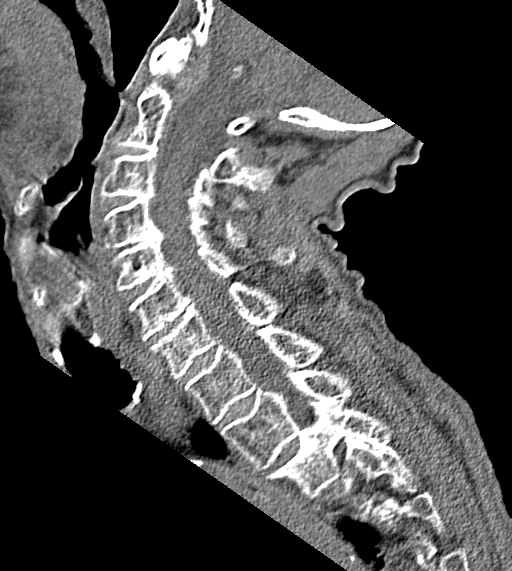
[im 30/59  bone]
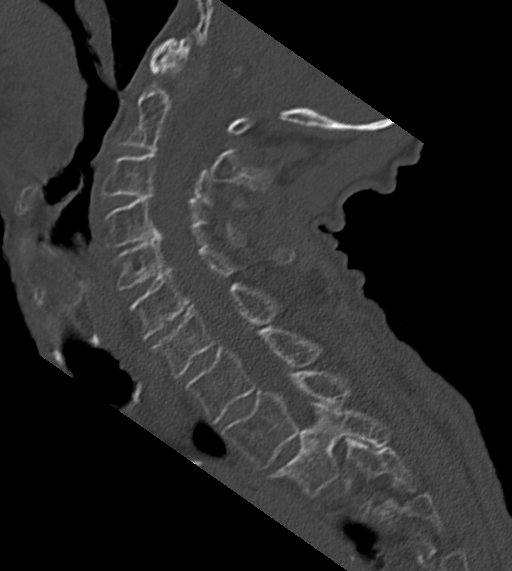
[im 34/59  bone]
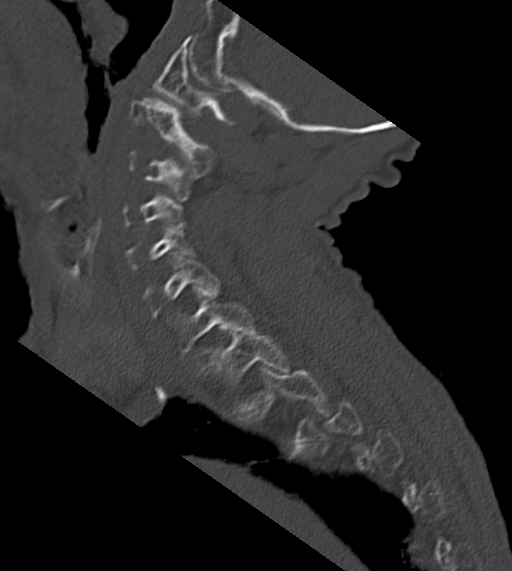
[im 39/59  bone]
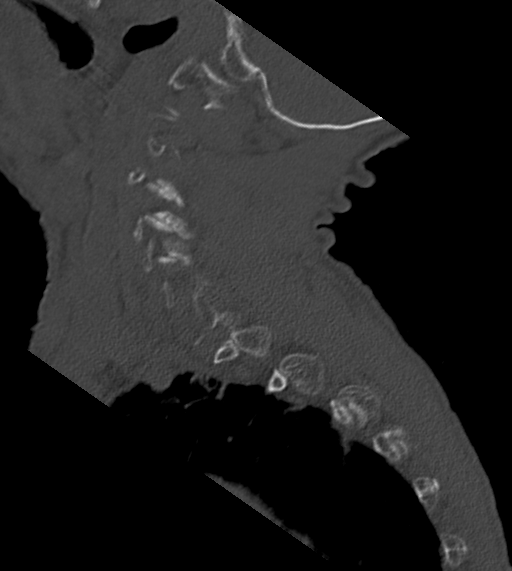

[Series 10: axial bone 2.0 · axial · 0.20mm/px · z∈[+31,+99]mm · 3 of 72 slices shown]
[im 18/72  bone]
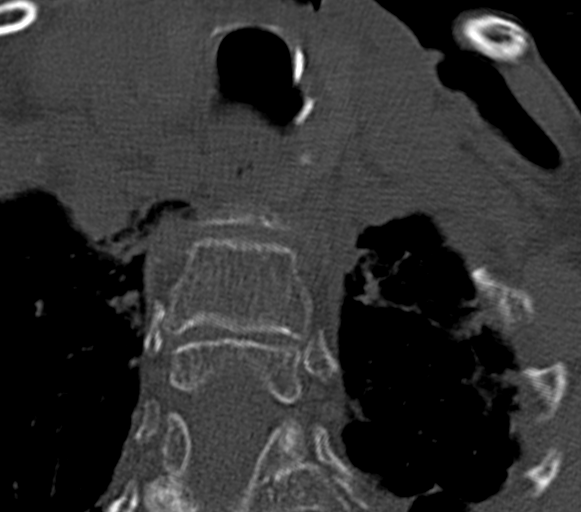
[im 36/72  bone]
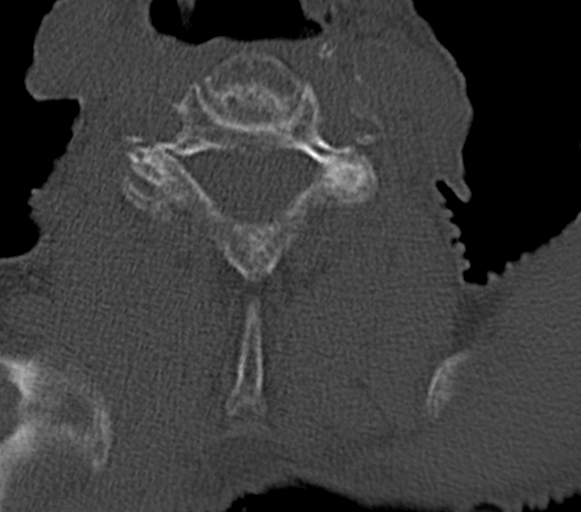
[im 54/72  bone]
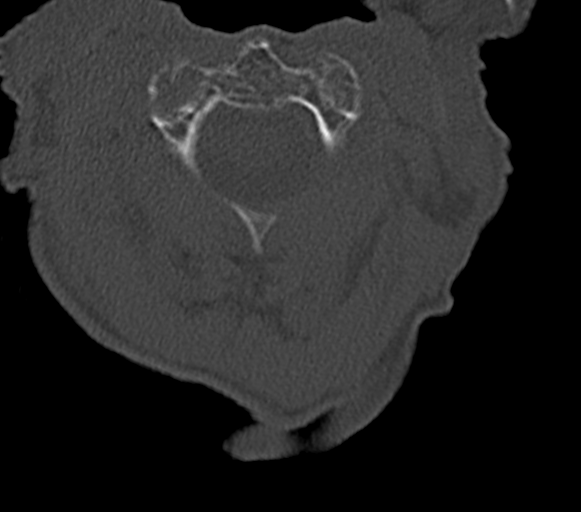

[13 of 27 positions shown; findings below may reference images not displayed]

FINDINGS: CT HEAD FINDINGS

Multiple well-defined foci of low attenuation in the subinsular
regions and basal ganglia bilaterally, compatible with old lacunar
infarctions. Patchy and confluent areas of decreased attenuation are
noted throughout the deep and periventricular white matter of the
cerebral hemispheres bilaterally, compatible with chronic
microvascular ischemic disease. No acute displaced skull fractures
are identified. No acute intracranial abnormality. Specifically, no
evidence of acute post-traumatic intracranial hemorrhage, no
definite regions of acute/subacute cerebral ischemia, no focal mass,
mass effect, hydrocephalus or abnormal intra or extra-axial fluid
collections. The visualized paranasal sinuses and mastoids are well
pneumatized.

CT CERVICAL SPINE FINDINGS

Compared to prior chest CT 06/17/2013 there is new compression of
the superior endplate of T3 with approximately 10% loss of anterior
vertebral body height and 25% loss of posterior vertebral body
height. Old compression fracture T4 appears unchanged. Within the
cervical spine itself there is no acute displaced fracture.
Exaggeration of normal cervical lordosis. Alignment is otherwise
anatomic. Multilevel degenerative disc disease, most severe at
C6-C7. Severe multilevel facet arthropathy. Prevertebral soft
tissues are normal.
IMPRESSION: 1. No evidence of significant acute traumatic injury to the head,
brain or cervical spine.
2. However, there is a new compression fracture of T3 when compared
to prior study 06/17/2013, as discussed above. Whether this
represents an acute injury, or something that happened in the recent
several months is uncertain on today's CT examination. Clinical
correlation is recommended.
3. Old compression fracture of T4 is unchanged.
4. Multilevel degenerative disc disease and cervical spondylosis.
5. Multifocal lacunar infarctions throughout the basal ganglia and
subinsular regions bilaterally, and extensive chronic microvascular
ischemic changes in the cerebral white matter.

## 2014-12-10 IMAGING — CR DG CHEST 1V PORT
1 series · 1 of 1 positions shown · non-contrast
Comparison: DG CHEST 1V PORT dated 01/23/2014

CLINICAL DATA: Patient found unresponsive

EXAM:
PORTABLE CHEST - 1 VIEW

[portable]
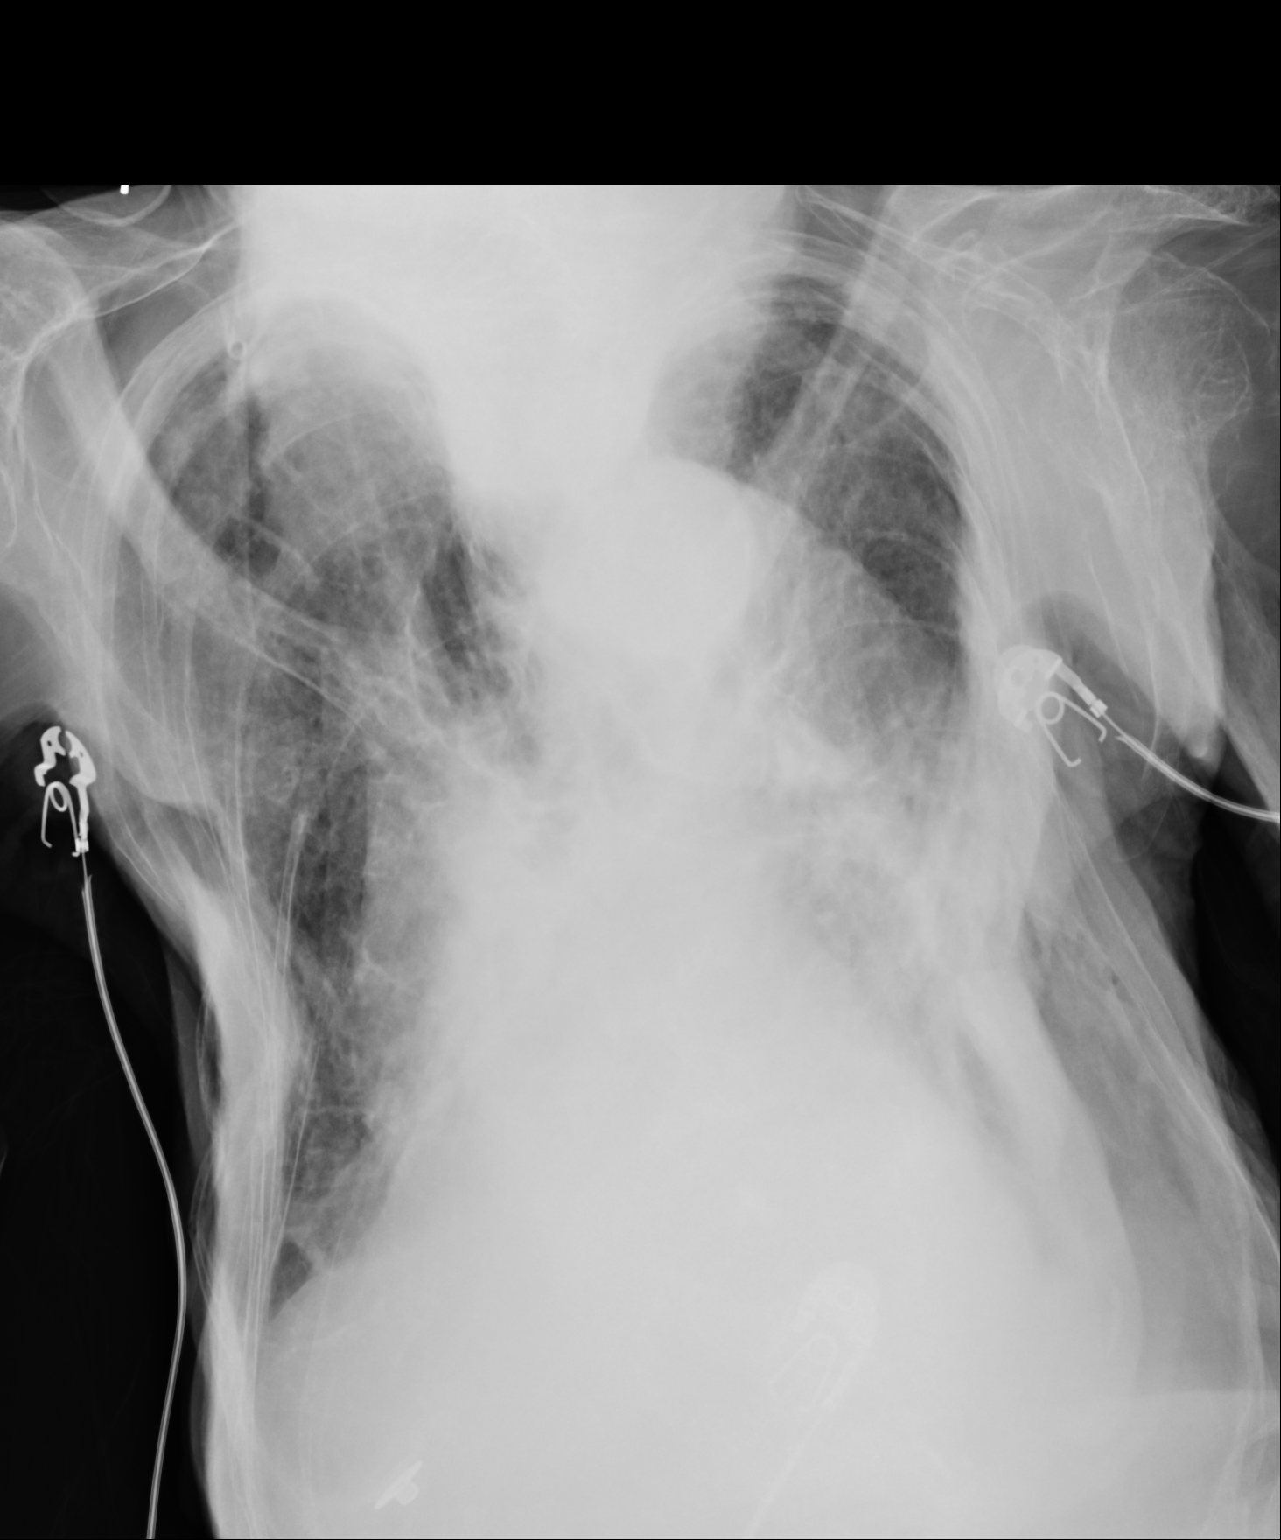

[1 of 1 positions shown; findings below may reference images not displayed]

FINDINGS: Limited examination secondary to technique. Patient is rotated.
There is no definite focal consolidation, pleural effusion or
pneumothorax. Stable cardiomegaly. Severe generalized osteopenia.
IMPRESSION: Limited examination secondary to technique. No definite acute
cardiopulmonary disease.
